# Patient Record
Sex: Female | Born: 1985 | Race: Black or African American | Hispanic: No | Marital: Single | Smoking: Never smoker
Health system: Southern US, Community
[De-identification: ages and names within clinical notes are randomized; demographics above are authoritative.]

## PROBLEM LIST (undated history)

## (undated) DIAGNOSIS — Z789 Other specified health status: Secondary | ICD-10-CM

## (undated) HISTORY — DX: Other specified health status: Z78.9

---
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
## 2006-08-08 ENCOUNTER — Inpatient Hospital Stay (HOSPITAL_COMMUNITY): Admission: EM | Admit: 2006-08-08 | Discharge: 2006-08-10 | Payer: Self-pay | Admitting: Emergency Medicine

## 2008-05-10 ENCOUNTER — Emergency Department (HOSPITAL_COMMUNITY): Admission: EM | Admit: 2008-05-10 | Discharge: 2008-05-10 | Payer: Self-pay | Admitting: Emergency Medicine

## 2008-06-09 ENCOUNTER — Emergency Department (HOSPITAL_COMMUNITY): Admission: EM | Admit: 2008-06-09 | Discharge: 2008-06-09 | Payer: Self-pay | Admitting: Emergency Medicine

## 2008-07-05 IMAGING — CT CT PELVIS W/ CM
2 of 4 series · 17 of 46 positions shown, 19 images · IV contrast (OMNI 350 25 ML & [ID] OMNI 300)
Comparison: none

CLINICAL DATA: Right lower abdominal pain, fever

CT abdomen with contrast:
Multidetector helical CT after 100 ml Vmnipaque-4TT IV.
No previous available for comparison. Visualized lung bases clear. Unremarkable
liver, gallbladder, spleen, adrenal glands, kidneys, abdominal aorta, pancreas,
small bowel. No free air. No ascites.

[Series 2: abd pelvis · axial · 0.70mm/px · z∈[-475,-100]mm · 14 of 83 slices shown, 16 images]
[im 4/83  soft-tissue]
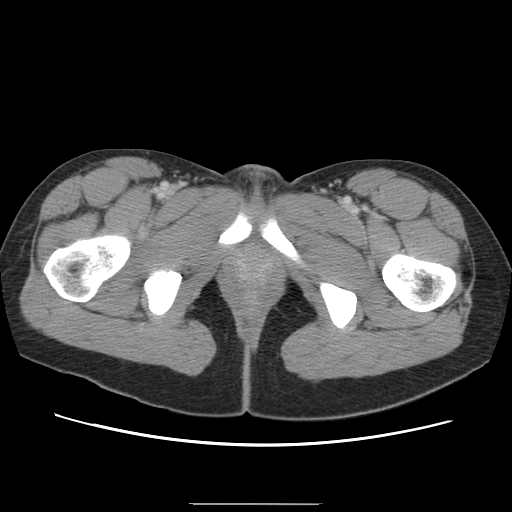
[im 4/83  bone]
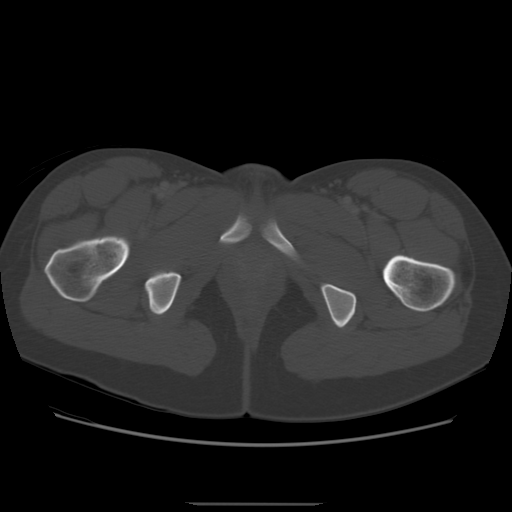
[im 11/83  soft-tissue]
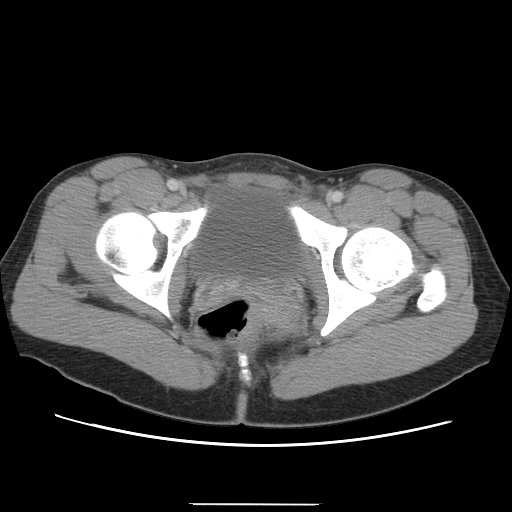
[im 18/83  soft-tissue]
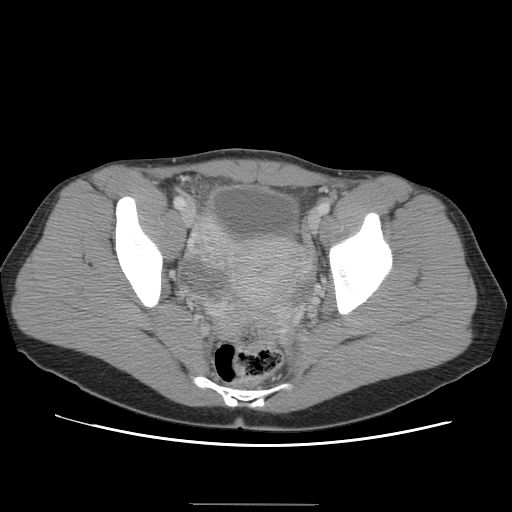
[im 21/83  soft-tissue]
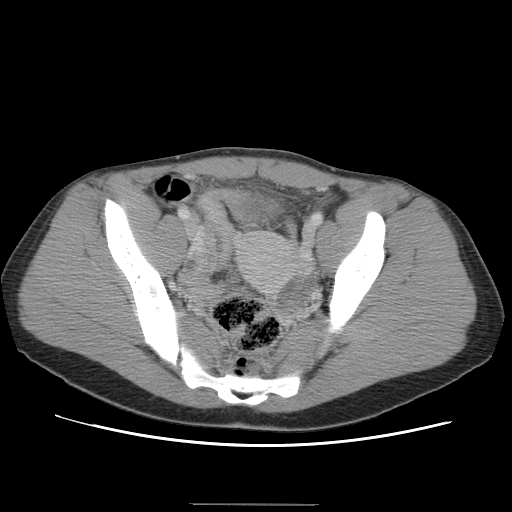
[im 28/83  soft-tissue]
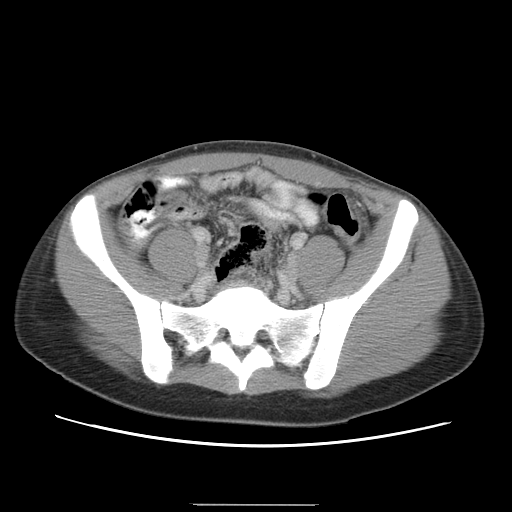
[im 35/83  soft-tissue]
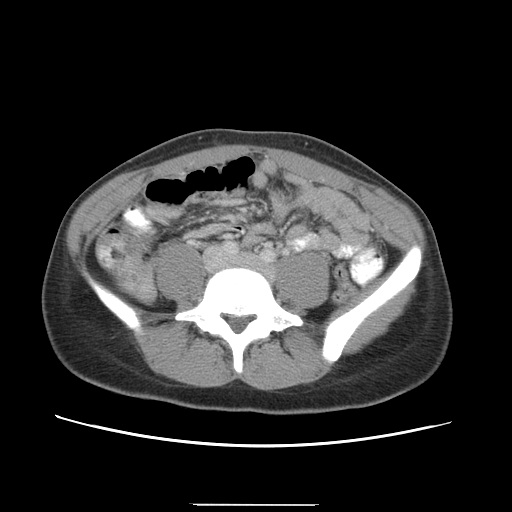
[im 38/83  soft-tissue]
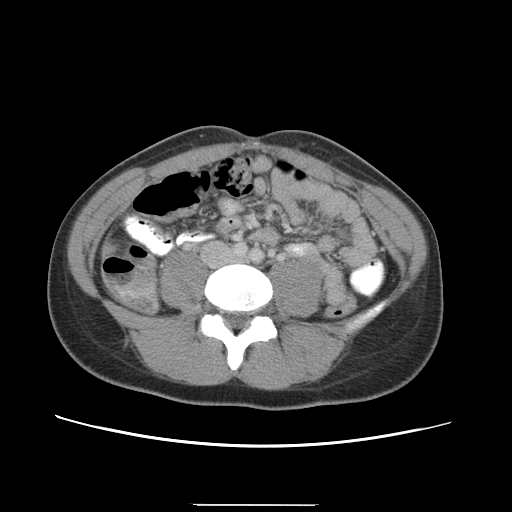
[im 45/83  soft-tissue]
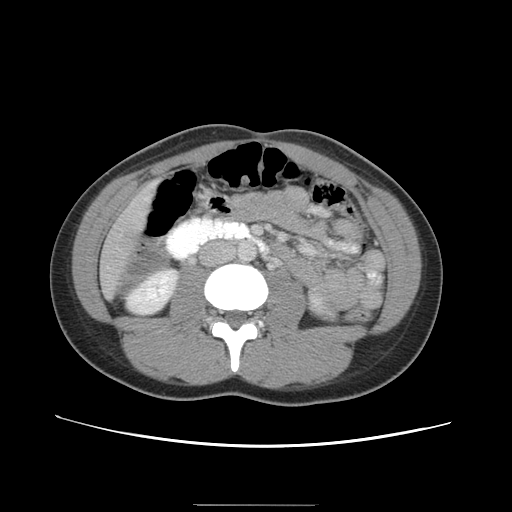
[im 48/83  soft-tissue]
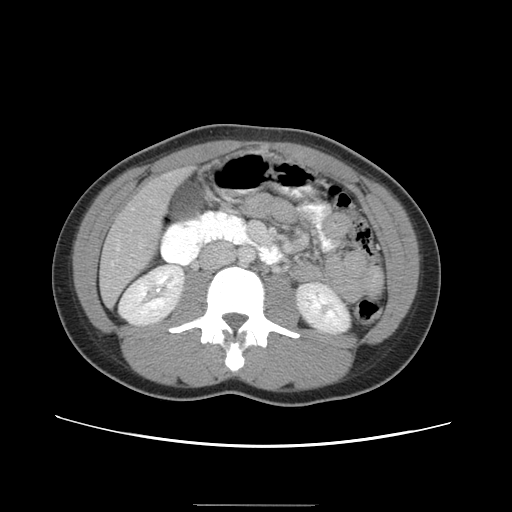
[im 48/83  bone]
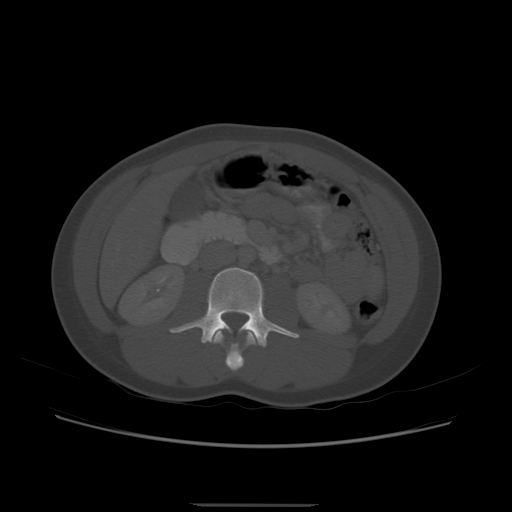
[im 55/83  soft-tissue]
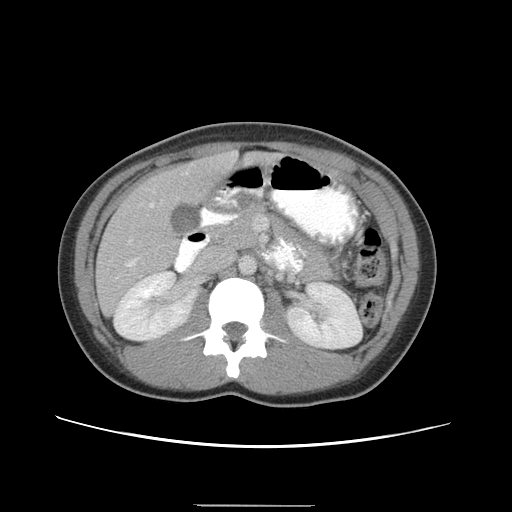
[im 62/83  soft-tissue]
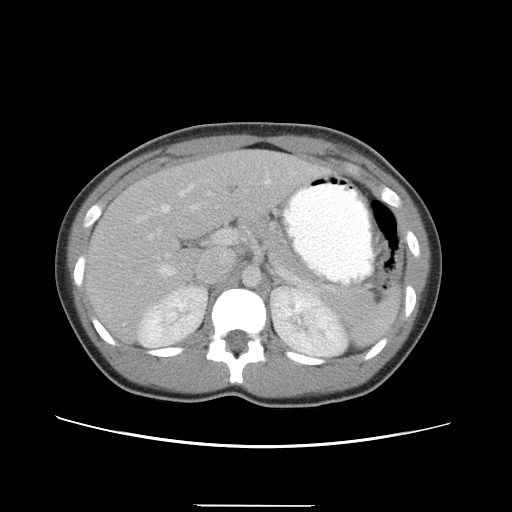
[im 65/83  soft-tissue]
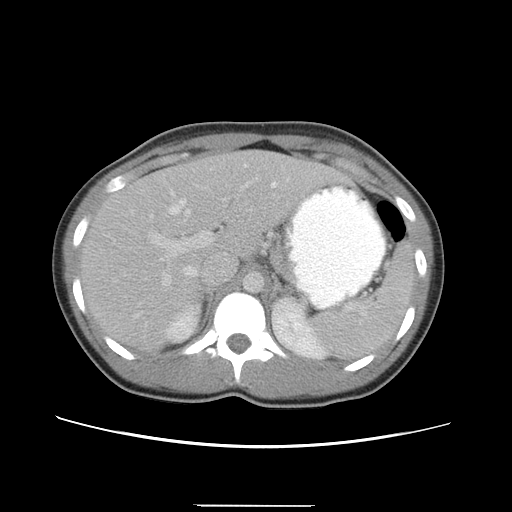
[im 72/83  soft-tissue]
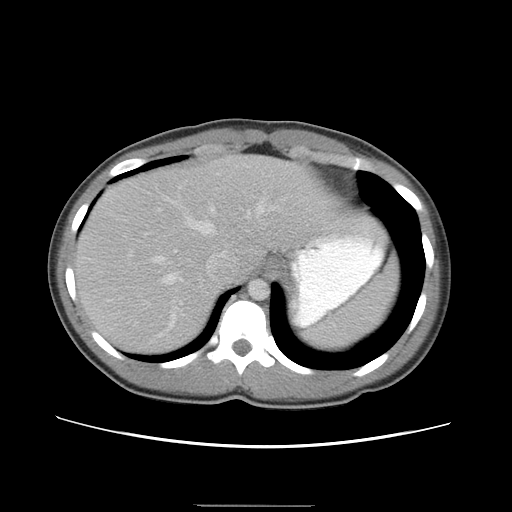
[im 79/83  soft-tissue]
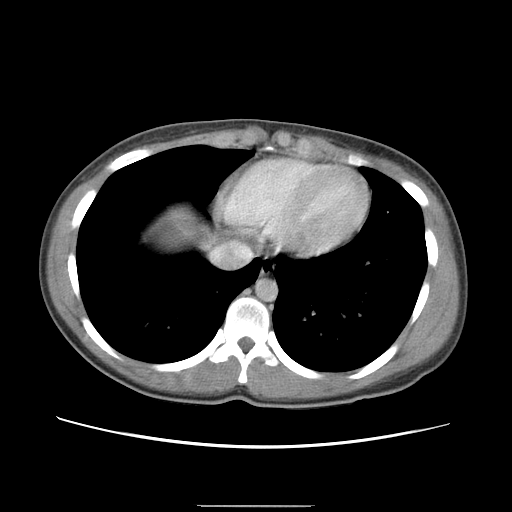

[Series 401: reformatted · coronal · 0.86mm/px · 3 of 87 slices shown]
[im 29/87  soft-tissue]
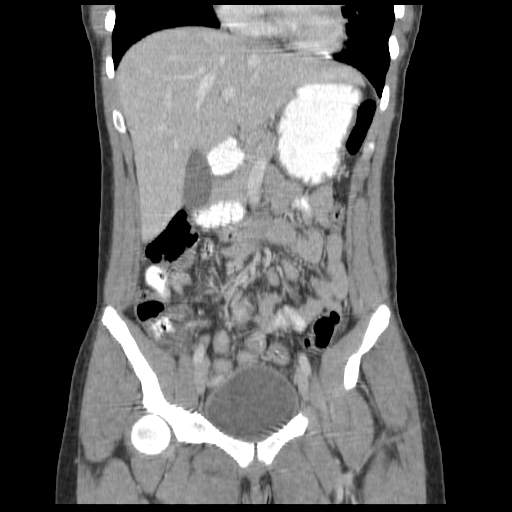
[im 39/87  soft-tissue]
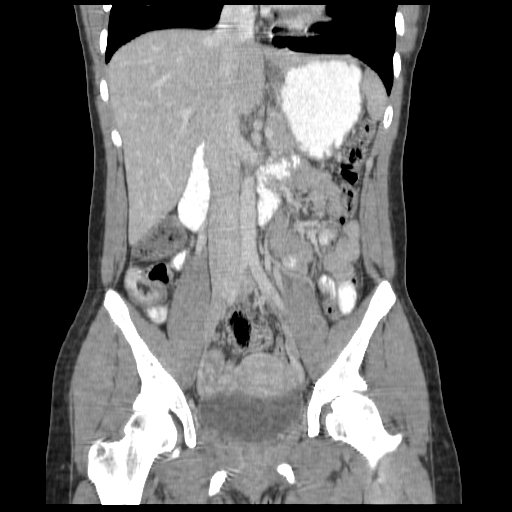
[im 48/87  soft-tissue]
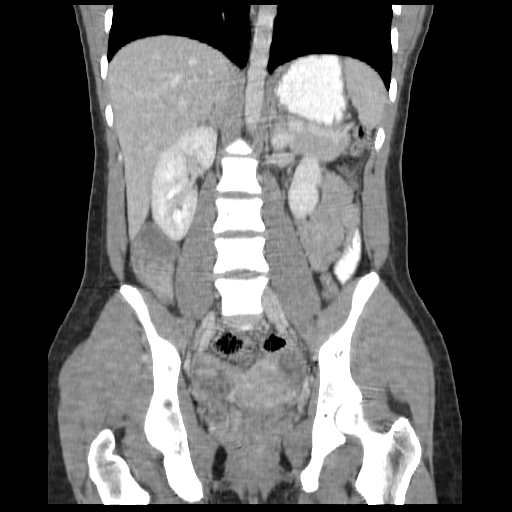

[17 of 46 positions shown; findings below may reference images not displayed]

IMPRESSION: 1. Unremarkable CT abdomen

CT pelvis with contrast:

Appendix not discretely identified. Remainder of the colon is nondilated,
otherwise unremarkable. The terminal ileum is incompletely distended. Prominent
relatively low attenuation  right ovary measures 3.9 cm maximal transverse
diameter. Uterus and left adnexal region unremarkable. Small amount of free
pelvic fluid. No pelvic adenopathy.
IMPRESSION: 1. Prominent asymmetric ovaries, right greater than left. If there is a clinical
concern for   torsion or tubo-ovarian abscess, pelvic ultrasound may be useful
for further evaluation.
2. Appendix is not discretely identified.

## 2008-11-25 ENCOUNTER — Emergency Department (HOSPITAL_COMMUNITY): Admission: EM | Admit: 2008-11-25 | Discharge: 2008-11-25 | Payer: Self-pay | Admitting: Emergency Medicine

## 2008-12-02 ENCOUNTER — Emergency Department (HOSPITAL_COMMUNITY): Admission: EM | Admit: 2008-12-02 | Discharge: 2008-12-02 | Payer: Self-pay | Admitting: Emergency Medicine

## 2009-11-13 ENCOUNTER — Emergency Department (HOSPITAL_COMMUNITY): Admission: EM | Admit: 2009-11-13 | Discharge: 2009-11-13 | Payer: Self-pay | Admitting: Emergency Medicine

## 2009-12-11 ENCOUNTER — Emergency Department (HOSPITAL_COMMUNITY): Admission: EM | Admit: 2009-12-11 | Discharge: 2009-12-11 | Payer: Self-pay | Admitting: *Deleted

## 2010-06-08 LAB — POCT PREGNANCY, URINE: Preg Test, Ur: NEGATIVE

## 2010-06-08 LAB — URINE MICROSCOPIC-ADD ON

## 2010-06-08 LAB — URINALYSIS, ROUTINE W REFLEX MICROSCOPIC
Bilirubin Urine: NEGATIVE
Glucose, UA: NEGATIVE mg/dL
Ketones, ur: NEGATIVE mg/dL
Leukocytes, UA: NEGATIVE
Protein, ur: NEGATIVE mg/dL

## 2010-07-17 NOTE — H&P (Signed)
NAMEBANI, GIANFRANCESCO            ACCOUNT NO.:  [id number]   MEDICAL RECORD NO.:  [id number]          PATIENT TYPE:  INP   LOCATION:  5733                         FACILITY:  [facility]   PHYSICIAN:  Della Goo, M.D. DATE OF BIRTH:  May 16, 1985   DATE OF ADMISSION:  08/08/2006  DATE OF DISCHARGE:                              HISTORY & PHYSICAL   This is an unassigned patient.   CHIEF COMPLAINT:  Abdominal and flank pain.   HISTORY OF PRESENT ILLNESS:  This is 25 year old female who presented to  an outside Urgent Care Center with complaints of right lower quadrant  pain that she reported had started after work last night.  The pain  radiated across the lower abdomen and was associated with painful  urination.  She also reports having right-sided flank pain.  She denies  having any changes in her bowel pattern.  She denies having any diarrhea  or constipation.  She denies having vaginal discharge.  The patient was  evaluated in the emergency department by the emergency department  physician.  Initial studies revealed a urinalysis with small leukocyte  esterase.  A pelvic exam was performed and a wet prep which did reveal  Trichomonas, clue cells, and moderate leukocytes.  A CBC and chemistry  were also performed.  The white blood cell count returned at 22.1 with  94% neutrophils.  The chemistry was within normal limits.  A CT of the  abdomen and pelvis was also ordered secondary to the patient's right  lower quadrant pain to evaluate for possible appendicitis, and the  results returned with prominent asymmetric ovaries, right greater than  left.  The appendix was not discretely identified; however, the results  were reported as being unremarkable of the abdomen.   PAST MEDICAL HISTORY:  None.   PAST SURGICAL HISTORY:  None.   MEDICATIONS:  None.   ALLERGIES:  NO KNOWN DRUG ALLERGIES.   SOCIAL HISTORY:  Positive tobacco, three cigarettes per day, Black and  Milds, for 5  years.  No history of alcohol usage.  No history of illicit  drug usage.   FAMILY HISTORY:  Hypertension in maternal grandfather.  No history of  diabetes, coronary artery disease, or cancer in her family that she  knows of.   PHYSICAL EXAMINATION:  GENERAL:  This is a 25 year old thin, well-  nourished, well-developed female in discomfort but no acute distress.  VITAL SIGNS:  Temperature 100.7, blood pressure 105/50, heart rate 88-  125, respirations 16-20, O2 saturations 99-100% on room air.  HEENT:  Normocephalic, atraumatic.  She does have old healed facial  lacerations, an oblique scar from the right oral corner.  This scar is  approximately 2 cm long.  Pupils equally round and reactive to light.  Extraocular muscles are intact.  Funduscopic benign.  Oropharynx is  clear.  NECK:  Supple, full range of motion.  No thyromegaly, adenopathy,  jugular venous distension.  CARDIOVASCULAR:  Regular rate and rhythm.  No murmurs, gallops or rubs.  LUNGS:  Clear to auscultation bilaterally.  ABDOMEN:  Positive bowel sounds, soft, mildly tender in the right lower  quadrant.  No rebound, no guarding.  No hepatosplenomegaly.  PELVIC:  Examination performed by the ER physician.  EXTREMITIES:  Without cyanosis, clubbing, or edema.  NEUROLOGIC:  Alert and oriented x3.  Nonfocal.   LABORATORY STUDIES:  White blood cell count 22.1, hemoglobin 12.0,  hematocrit 37.0, platelets 242, neutrophils 94%.  Sodium 136, potassium  3.6, chloride 105, carbon dioxide 25, BUN 8, creatinine 0.85, glucose  113.  Albumin 3.7, AST 15, ALT 10.  Urinalysis:  Trace urine hemoglobin,  small leukocyte esterase, and positive Trichomonas.  Wet prep, as  mentioned above, along with CT scan results mentioned above.   ASSESSMENT:  25 year old female being admitted with:  1. Abdominal pain.  2. Pyelonephritis and possible pelvic inflammatory disease.  3. Trichomonas vaginitis.  4. Leukocytosis.   PLAN:  The patient has  been treated initially with Rocephin and  azithromycin to cover for gonorrhea and chlamydial infections.  Gonorrhea and chlamydia screens have been performed and are pending.  The patient will be placed on Rocephin daily IV to treat the  pyelonephritis/UTI, and Flagyl therapy has also been initiated in a  single dose therapy for treatment of Trichomonas at 2 grams p.o. x1  dose.  Further treatment will ensue pending culture results.  The  patient will be placed on IV fluids for fluid rehydration, and  antiemetic and pain therapy have been ordered.      Della Goo, M.D.  Electronically Signed     HJ/MEDQ  D:  08/09/2006  T:  08/10/2006  Job:  [id number]

## 2010-07-20 NOTE — Op Note (Signed)
   NAME:  [name], [name] NO.:  [id number]   MEDICAL RECORD NO.:  [id number]                   PATIENT TYPE:  INP   LOCATION:  LDR1                                 FACILITY:  [facility]   PHYSICIAN:  Lazaro Arms, M.D.                DATE OF BIRTH:  12-28-1985   DATE OF PROCEDURE:  09/29/2002  DATE OF DISCHARGE:                                 OPERATIVE REPORT   EPIDURAL PLACEMENT NOTE:  Phyllis Lawson is a 25 year old African-American female,  gravida 1, at 4 cm dilated requesting an epidural be placed.   She is placed in sitting position.  The iliac crests are identified; the L2-  L3, and L3-L4 interspaces are identified.  Betadine prep is used, field  drape is placed, and 1% Lidocaine is placed at the L3-L4 interspace.  A 17-  gauge Touhy needle was used and a loss of resistance technique employed.  By  the 3 cm mark despite using the loss of resistance technique a wet tap is  performed.  I immediately withdrew the 17-gauge Touhy needle and placed it  again.  The epidural space is basically 2.5 cm from the skin; 15 cc of  peripheral blood was drawn and injected as a blood patch. Ten cc of 8%  Marcaine was injected as a test dose with no ill effects.  There was no  ringing in the ears and no metallic taste and no other symptomatology.  An  epidural catheter was threaded without difficulty.  An additional 10 cc was  given through the catheter; again, with no ill effects.  There is no  epidural pump available.  The patient is now 9 cm.  It was taped down at  this point and no continuous pump is to be used currently.  The patient is  informed of the wet tap and that she may indeed still get a spinal headache  despite the blood patch placed at this time.  If that is the case we will  place another blood patch later.  If she needs any more medicine we will  give her 5 cc of 1.5% 1% lidocaine.                                               Lazaro Arms, M.D.    Loraine Maple  D:  09/29/2002  T:  09/29/2002  Job:  [id number]

## 2010-07-20 NOTE — Op Note (Signed)
   NAME:  VERLEE, POPE NO.:  [id number]   MEDICAL RECORD NO.:  [id number]                   PATIENT TYPE:  INP   LOCATION:  LDR1                                 FACILITY:  [facility]   PHYSICIAN:  Lazaro Arms, M.D.                DATE OF BIRTH:  1985-04-22   DATE OF PROCEDURE:  DATE OF DISCHARGE:                                  PROCEDURE NOTE   Onset of labor is going to be September 29, 2002 at 9 a.m.  Date of delivery September 29, 2002 at 1509 p.m.  Length of first stage of labor: 5 hours and 25  minutes.  Length of second stage of labor:  44 minutes.  Length of third  stage of labor:  6 minutes.   DELIVERY NOTE:  [place] had a normal spontaneous vaginal delivery under  epidural anesthesia with a viable female infant.  Apgars were 9 and 9.  Upon  delivery of head, shoulders were easily delivered.  Infant was thoroughly  suctioned, dried, cord clamped and placed on the mother's abdomen in good  condition with vigorous cry, good movement of all extremities and pink  color.  First stage of labor was actively managed with 1000 cc of D5LR at a  rapid rate.  Placenta was delivered spontaneously via Schultze  mechanism.  A 3-vessel cord was noted upon inspection and membranes were intact upon  inspection.  Estimated blood loss:  Approximately 300 cc.  The epidural  catheter was removed with low tip intact, without complications.     Zerita Boers, N.M.                      Lazaro Arms, M.D.    DL/MEDQ  D:  16/12/9602  T:  09/29/2002  Job:  [id number]   cc:   Sharyon Medicus
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
## 2010-08-02 ENCOUNTER — Other Ambulatory Visit (HOSPITAL_COMMUNITY)
Admission: RE | Admit: 2010-08-02 | Discharge: 2010-08-02 | Disposition: A | Discharge status: Home / Self Care | Payer: BC Managed Care – PPO | Source: Ambulatory Visit | Attending: Obstetrics and Gynecology | Admitting: Obstetrics and Gynecology

## 2010-08-02 DIAGNOSIS — Z113 Encounter for screening for infections with a predominantly sexual mode of transmission: Secondary | ICD-10-CM | POA: Insufficient documentation

## 2010-08-02 DIAGNOSIS — Z01419 Encounter for gynecological examination (general) (routine) without abnormal findings: Secondary | ICD-10-CM | POA: Insufficient documentation

## 2010-10-27 ENCOUNTER — Inpatient Hospital Stay (INDEPENDENT_AMBULATORY_CARE_PROVIDER_SITE_OTHER)
Admission: RE | Admit: 2010-10-27 | Discharge: 2010-10-27 | Disposition: A | Discharge status: Home / Self Care | Payer: BC Managed Care – PPO | Source: Ambulatory Visit | Attending: Family Medicine | Admitting: Family Medicine

## 2010-10-27 ENCOUNTER — Ambulatory Visit (INDEPENDENT_AMBULATORY_CARE_PROVIDER_SITE_OTHER): Payer: BC Managed Care – PPO

## 2010-10-27 DIAGNOSIS — S6390XA Sprain of unspecified part of unspecified wrist and hand, initial encounter: Secondary | ICD-10-CM

## 2010-12-20 LAB — POCT URINALYSIS DIP (DEVICE)
Glucose, UA: 100 — AB
Nitrite: NEGATIVE
Operator id: 116391
Urobilinogen, UA: 4 — ABNORMAL HIGH

## 2010-12-20 LAB — DIFFERENTIAL
Basophils Absolute: 0
Basophils Relative: 0
Eosinophils Absolute: 0
Lymphocytes Relative: 3 — ABNORMAL LOW
Lymphs Abs: 0.7
Monocytes Relative: 3
Neutro Abs: 20.7 — ABNORMAL HIGH

## 2010-12-20 LAB — GC/CHLAMYDIA PROBE AMP, GENITAL
Chlamydia, DNA Probe: POSITIVE — AB
GC Probe Amp, Genital: POSITIVE — AB

## 2010-12-20 LAB — COMPREHENSIVE METABOLIC PANEL
AST: 15
Albumin: 3.7
Alkaline Phosphatase: 73
Chloride: 105
GFR calc Af Amer: >60
Potassium: 3.6
Total Bilirubin: 0.8
Total Protein: 7

## 2010-12-20 LAB — WET PREP, GENITAL

## 2010-12-20 LAB — HIV ANTIBODY (ROUTINE TESTING W REFLEX): HIV: NONREACTIVE

## 2010-12-20 LAB — POCT PREGNANCY, URINE
Operator id: 116391
Preg Test, Ur: NEGATIVE

## 2010-12-20 LAB — CBC
Platelets: 242
RDW: 15.1 — ABNORMAL HIGH
WBC: 22.1 — ABNORMAL HIGH

## 2010-12-20 LAB — URINE MICROSCOPIC-ADD ON

## 2010-12-20 LAB — URINALYSIS, ROUTINE W REFLEX MICROSCOPIC
Bilirubin Urine: NEGATIVE
Glucose, UA: NEGATIVE
Specific Gravity, Urine: >1.046 — ABNORMAL HIGH
Urobilinogen, UA: 1

## 2010-12-20 LAB — HEPATITIS PANEL, ACUTE
Hep B C IgM: NEGATIVE
Hepatitis B Surface Ag: NEGATIVE

## 2010-12-20 LAB — CULTURE, BLOOD (ROUTINE X 2)

## 2011-11-23 ENCOUNTER — Emergency Department (INDEPENDENT_AMBULATORY_CARE_PROVIDER_SITE_OTHER)
Admission: EM | Admit: 2011-11-23 | Discharge: 2011-11-23 | Disposition: A | Discharge status: Home / Self Care | Payer: BC Managed Care – PPO | Source: Home / Self Care | Attending: Emergency Medicine | Admitting: Emergency Medicine

## 2011-11-23 ENCOUNTER — Encounter (HOSPITAL_COMMUNITY): Payer: Self-pay

## 2011-11-23 DIAGNOSIS — T148XXA Other injury of unspecified body region, initial encounter: Secondary | ICD-10-CM

## 2011-11-23 MED ORDER — MELOXICAM 15 MG PO TABS: 15.0000 mg | ORAL_TABLET | Freq: Every day | ORAL | Status: DC

## 2011-11-23 NOTE — ED Notes (Signed)
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

## 2011-11-23 NOTE — ED Provider Notes (Signed)
History     CSN: [id number]  Arrival date & time 11/23/11  4098   First MD Initiated Contact with Patient 11/23/11 [phone]      Chief Complaint  Patient presents with  . Hand Pain    (Consider location/radiation/quality/duration/timing/severity/associated sxs/prior treatment) HPI Comments: Right-handed female presents with left lateral hand pain, worse with gripping objects, use. 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 No recent or remote history of trauma to her hand. No numbness, weakness, paresthesias, bruising, deformity. She is not a smoker or diabetic.  ROS as noted in HPI. All other ROS negative.   Patient is a 26 y.o. female presenting with hand pain. The history is provided by the patient. No language interpreter was used.  Hand Pain This is a new problem. The current episode started more than 1 week ago. The problem occurs daily. The problem has not changed since onset.The symptoms are aggravated by twisting. Nothing relieves the symptoms. She has tried nothing for the symptoms. The treatment provided no relief.    History reviewed. No pertinent past medical history.  History reviewed. No pertinent past surgical history.  History reviewed. No pertinent family history.  History  Substance Use Topics  . Smoking status: Never Smoker   . Smokeless tobacco: Not on file  . Alcohol Use: Yes    OB History    Grav Para Term Preterm Abortions TAB SAB Ect Mult Living                  Review of Systems  Allergies  Review of patient's allergies indicates no known allergies.  Home Medications   Current Outpatient Rx  Name Route Sig Dispense Refill  . MELOXICAM 15 MG PO TABS Oral Take 1 tablet (15 mg total) by mouth daily. 14 tablet 0    BP 115/59  Pulse 70  Temp 98.8 F (37.1 C) (Oral)  Resp 18  SpO2 100%  LMP 11/21/2011  Physical Exam  Nursing note and vitals  reviewed. Constitutional: She is oriented to person, place, and time. She appears well-developed and well-nourished. No distress.  HENT:  Head: Normocephalic and atraumatic.  Eyes: Conjunctivae normal and EOM are normal.  Neck: Normal range of motion.  Cardiovascular: Normal rate.   Pulmonary/Chest: Effort normal.  Abdominal: She exhibits no distension.  Musculoskeletal: Normal range of motion.       Hands:      Mild soft tissue tenderness along 4th 5th metacarpals. No bony tenderness. Pain aggravated with grasping. Tinel negative.  Grip 5/5 bilaterally.  Strength and Sensation to L hand with normal light touch intact for Pt, distal motor and sensation in median/radial/ulnar nerve distribution with CR< 2 secs and pulse intact. Skin intact. No signs of trauma. Wrist WNL.    Neurological: She is alert and oriented to person, place, and time. Coordination normal.  Skin: Skin is warm and dry.  Psychiatric: She has a normal mood and affect. Her behavior is normal. Judgment and thought content normal.    ED Course  Procedures (including critical care time)  Labs Reviewed - No data to display No results found.   1. Repetitive strain injury     MDM  No bony tenderness, deferring imaging. Presentation is most consistent with repetitive strain injury. Place him on her gutter splint for comfort, protection. Also so that she can continue use her left hand for daily activities. Home with Mobic. Referring  to Dr. Izora Ribas, hand on call, if no improvement after conservative therapy for 10 days. Discussed signs and symptoms that should prompt return to the department. Patient agrees with plan.  Luiz Blare, MD 11/23/11 1014

## 2011-11-23 NOTE — Progress Notes (Signed)
Orthopedic Tech Progress Note Patient Details:  Phyllis Lawson 01-Jun-1985 [id number]  Ortho Devices Type of Ortho Device: Ulna gutter splint Ortho Device/Splint Location: left hand Ortho Device/Splint Interventions: Application   [name] 11/23/2011, 10:52 AM

## 2012-05-12 ENCOUNTER — Other Ambulatory Visit (HOSPITAL_COMMUNITY)
Admission: RE | Admit: 2012-05-12 | Discharge: 2012-05-12 | Disposition: A | Discharge status: Home / Self Care | Payer: BC Managed Care – PPO | Source: Ambulatory Visit | Attending: Obstetrics and Gynecology | Admitting: Obstetrics and Gynecology

## 2012-05-12 ENCOUNTER — Other Ambulatory Visit: Payer: Self-pay | Admitting: Nurse Practitioner

## 2012-05-12 DIAGNOSIS — N760 Acute vaginitis: Secondary | ICD-10-CM | POA: Insufficient documentation

## 2012-05-12 DIAGNOSIS — Z01419 Encounter for gynecological examination (general) (routine) without abnormal findings: Secondary | ICD-10-CM | POA: Insufficient documentation

## 2016-09-17 ENCOUNTER — Encounter (HOSPITAL_COMMUNITY): Payer: Self-pay

## 2016-09-17 ENCOUNTER — Emergency Department (HOSPITAL_COMMUNITY): Payer: BLUE CROSS/BLUE SHIELD

## 2016-09-17 ENCOUNTER — Emergency Department (HOSPITAL_COMMUNITY)
Admission: EM | Admit: 2016-09-17 | Discharge: 2016-09-17 | Disposition: A | Discharge status: Home / Self Care | Payer: BLUE CROSS/BLUE SHIELD | Attending: Emergency Medicine | Admitting: Emergency Medicine

## 2016-09-17 DIAGNOSIS — Y999 Unspecified external cause status: Secondary | ICD-10-CM | POA: Insufficient documentation

## 2016-09-17 DIAGNOSIS — Y9389 Activity, other specified: Secondary | ICD-10-CM | POA: Insufficient documentation

## 2016-09-17 DIAGNOSIS — S161XXA Strain of muscle, fascia and tendon at neck level, initial encounter: Secondary | ICD-10-CM | POA: Diagnosis not present

## 2016-09-17 DIAGNOSIS — Z79899 Other long term (current) drug therapy: Secondary | ICD-10-CM | POA: Insufficient documentation

## 2016-09-17 DIAGNOSIS — Y929 Unspecified place or not applicable: Secondary | ICD-10-CM | POA: Insufficient documentation

## 2016-09-17 DIAGNOSIS — M25512 Pain in left shoulder: Secondary | ICD-10-CM | POA: Insufficient documentation

## 2016-09-17 DIAGNOSIS — S199XXA Unspecified injury of neck, initial encounter: Secondary | ICD-10-CM | POA: Diagnosis present

## 2016-09-17 MED ORDER — METHOCARBAMOL 500 MG PO TABS: 500.0000 mg | ORAL_TABLET | Freq: Two times a day (BID) | ORAL | 0 refills | Status: DC

## 2016-09-17 MED ORDER — NAPROXEN 500 MG PO TABS: 500.0000 mg | ORAL_TABLET | Freq: Two times a day (BID) | ORAL | 0 refills | Status: DC

## 2016-09-17 NOTE — ED Notes (Signed)
Pt verbalized understanding of d/c instructions and has no further questions. Pt stable and NAD. Ambulatory.

## 2016-09-17 NOTE — ED Notes (Signed)
Pt taken to xray 
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
## 2016-09-17 NOTE — Discharge Instructions (Signed)
Ice/heat. Naprosyn for pain. Robaxin for spasms. Follow up with family doctor as needed.

## 2017-08-12 ENCOUNTER — Encounter (HOSPITAL_COMMUNITY): Payer: Self-pay | Admitting: Emergency Medicine

## 2017-08-12 ENCOUNTER — Emergency Department (HOSPITAL_COMMUNITY): Payer: Self-pay

## 2017-08-12 ENCOUNTER — Emergency Department (HOSPITAL_COMMUNITY)
Admission: EM | Admit: 2017-08-12 | Discharge: 2017-08-12 | Disposition: A | Discharge status: Home / Self Care | Payer: Self-pay | Attending: Emergency Medicine | Admitting: Emergency Medicine

## 2017-08-12 ENCOUNTER — Other Ambulatory Visit: Payer: Self-pay

## 2017-08-12 DIAGNOSIS — M549 Dorsalgia, unspecified: Secondary | ICD-10-CM | POA: Insufficient documentation

## 2017-08-12 DIAGNOSIS — M25512 Pain in left shoulder: Secondary | ICD-10-CM | POA: Insufficient documentation

## 2017-08-12 MED ORDER — NAPROXEN 500 MG PO TABS: 500.0000 mg | ORAL_TABLET | Freq: Two times a day (BID) | ORAL | 0 refills | Status: DC

## 2017-08-12 MED ORDER — CYCLOBENZAPRINE HCL 10 MG PO TABS: 10.0000 mg | ORAL_TABLET | Freq: Two times a day (BID) | ORAL | 0 refills | Status: DC | PRN
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
## 2017-08-12 NOTE — ED Notes (Signed)
Patient transported to X-ray 
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
## 2018-02-26 ENCOUNTER — Encounter: Payer: Self-pay | Admitting: Obstetrics & Gynecology

## 2018-03-04 NOTE — L&D Delivery Note (Addendum)
Patient: Phyllis Lawson MRN: [id number]  GBS status: Negative  Patient is a 33 y.o. now G2P1001 s/p NSVD at [redacted]w[redacted]d, who was admitted for SOL. AROM 8h 74m prior to delivery with clear fluid.    Delivery Note At 8:04 PM a viable female was delivered via Vaginal, Spontaneous (Presentation Right OA).  APGAR: 9,9 ; weight pending.   Placenta status: spontaneous, intact. Cord: 3 vessel, intact with the following complications: Shoulder dystocia resolved with McRoberts.   Anesthesia: None  Episiotomy: None Lacerations: None  Est. Blood Loss (mL): 44   Head delivered right OA. No nuchal cord present. Brief shoulder dystocia lasting less than 30 seconds likely related to maternal position and tight intriotus that resolved with McRoberts, body delivered in usual fashion. Infant with spontaneous cry, placed on mother's abdomen, dried and bulb suctioned. Cord clamped x 2 after 1-minute delay, and cut by family member. Cord blood drawn. Placenta delivered spontaneously with gentle cord traction. Fundus firm with massage and Pitocin. Perineum inspected and found to have no lacerations.  Mom to postpartum.  Baby to Couplet care / Skin to Skin.  Patriciaann Clan 09/08/2018, 8:18 PM    OB FELLOW DELIVERY ATTESTATION  I was gloved and present for the delivery in its entirety, and I agree with the above resident's note.    [name], D.O. OB Fellow  09/08/2018, 8:30 PM

## 2018-03-16 ENCOUNTER — Encounter: Payer: Self-pay | Admitting: Obstetrics & Gynecology

## 2018-03-16 ENCOUNTER — Ambulatory Visit (INDEPENDENT_AMBULATORY_CARE_PROVIDER_SITE_OTHER): Payer: 59 | Admitting: Obstetrics & Gynecology

## 2018-03-16 VITALS — BP 124/64 | HR 91 | Wt 186.0 lb

## 2018-03-16 DIAGNOSIS — Z23 Encounter for immunization: Secondary | ICD-10-CM

## 2018-03-16 DIAGNOSIS — O09891 Supervision of other high risk pregnancies, first trimester: Secondary | ICD-10-CM

## 2018-03-16 DIAGNOSIS — Z1151 Encounter for screening for human papillomavirus (HPV): Secondary | ICD-10-CM | POA: Diagnosis not present

## 2018-03-16 DIAGNOSIS — Z113 Encounter for screening for infections with a predominantly sexual mode of transmission: Secondary | ICD-10-CM

## 2018-03-16 DIAGNOSIS — Z3480 Encounter for supervision of other normal pregnancy, unspecified trimester: Secondary | ICD-10-CM | POA: Insufficient documentation

## 2018-03-16 DIAGNOSIS — O09899 Supervision of other high risk pregnancies, unspecified trimester: Secondary | ICD-10-CM

## 2018-03-16 DIAGNOSIS — Z124 Encounter for screening for malignant neoplasm of cervix: Secondary | ICD-10-CM

## 2018-03-16 NOTE — Progress Notes (Signed)
  Subjective:    Phyllis Lawson is being seen today for her first obstetrical visit.  This is not a planned pregnancy. She is at [redacted]w[redacted]d gestation. Her obstetrical history is significant for overweight. Relationship with FOB: significant other, living together. Patient does intend to breast feed. Pregnancy history fully reviewed.  Patient reports no complaints.  Review of Systems:   Review of Systems  Objective:     BP 124/64   Pulse 91   Wt 186 lb (84.4 kg)   LMP 12/29/2017   BMI 29.13 kg/m  Physical Exam  Exam Heart- rrr Lungs- CTAB Abd- benign Vulva, vagina, cervix- normal Pelvis- proven for 6#5 ounces   Assessment:    Pregnancy: G2P1000 Patient Active Problem List   Diagnosis Date Noted  . Supervision of other high risk pregnancy, antepartum 03/16/2018       Plan:     Initial labs drawn. Prenatal vitamins. Problem list reviewed and updated. AFP3 discussed: declined. Role of ultrasound in pregnancy discussed; fetal survey: ordered. Amniocentesis discussed: not indicated. Baby scripts ([place])   Emily Filbert 03/16/2018

## 2018-03-16 NOTE — Progress Notes (Signed)
Bedside U/S shows CRL of 68.63mm  Gestational age is [redacted]w[redacted]d  FHT of 174 BPM

## 2018-03-19 LAB — CYTOLOGY - PAP
Chlamydia: NEGATIVE
Diagnosis: NEGATIVE
HPV 16/18/45 genotyping: NEGATIVE
HPV: DETECTED — AB
Neisseria Gonorrhea: NEGATIVE

## 2018-03-20 LAB — URINE CULTURE, OB REFLEX

## 2018-03-20 LAB — CULTURE, OB URINE

## 2018-03-23 ENCOUNTER — Telehealth: Payer: Self-pay | Admitting: *Deleted

## 2018-03-23 ENCOUNTER — Encounter: Payer: Self-pay | Admitting: *Deleted

## 2018-03-23 DIAGNOSIS — O09899 Supervision of other high risk pregnancies, unspecified trimester: Secondary | ICD-10-CM

## 2018-03-23 MED ORDER — NITROFURANTOIN MONOHYD MACRO 100 MG PO CAPS: ORAL_CAPSULE | ORAL | 0 refills | Status: DC

## 2018-03-23 NOTE — Telephone Encounter (Signed)
-----   Message from Emily Filbert, MD sent at 03/23/2018  7:01 AM EST ----- She needs macrobid for 10 days. Thanks

## 2018-03-23 NOTE — Telephone Encounter (Signed)
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

## 2018-03-24 LAB — OBSTETRIC PANEL
ANTIBODY SCREEN: NOT DETECTED
Absolute Monocytes: 739 cells/uL (ref 200–950)
BASOS PCT: 0.2 %
Basophils Absolute: 22 cells/uL (ref 0–200)
EOS ABS: 134 {cells}/uL (ref 15–500)
EOS PCT: 1.2 %
HEMATOCRIT: 36.2 % (ref 35.0–45.0)
HEMOGLOBIN: 12.2 g/dL (ref 11.7–15.5)
Hepatitis B Surface Ag: NONREACTIVE
Lymphs Abs: 2498 cells/uL (ref 850–3900)
MCH: 27 pg (ref 27.0–33.0)
MCHC: 33.7 g/dL (ref 32.0–36.0)
MCV: 80.1 fL (ref 80.0–100.0)
MONOS PCT: 6.6 %
MPV: 10.3 fL (ref 7.5–12.5)
NEUTROS ABS: 7806 {cells}/uL — AB (ref 1500–7800)
Neutrophils Relative %: 69.7 %
Platelets: 275 10*3/uL (ref 140–400)
RBC: 4.52 10*6/uL (ref 3.80–5.10)
RDW: 13.1 % (ref 11.0–15.0)
RPR: NONREACTIVE
Rubella: 0.96 index — ABNORMAL LOW
Total Lymphocyte: 22.3 %
WBC: 11.2 10*3/uL — AB (ref 3.8–10.8)

## 2018-03-24 LAB — HEMOGLOBIN A1C
HEMOGLOBIN A1C: 5.5 %{Hb} (ref <5.7)
MEAN PLASMA GLUCOSE: 111 (calc)
eAG (mmol/L): 6.2 (calc)

## 2018-03-24 LAB — CYSTIC FIBROSIS DIAGNOSTIC STUDY

## 2018-03-24 LAB — HIV ANTIBODY (ROUTINE TESTING W REFLEX): HIV 1&2 Ab, 4th Generation: NONREACTIVE

## 2018-05-01 ENCOUNTER — Ambulatory Visit (HOSPITAL_COMMUNITY)
Admission: RE | Admit: 2018-05-01 | Discharge: 2018-05-01 | Disposition: A | Discharge status: Home / Self Care | Payer: 59 | Source: Ambulatory Visit | Attending: Obstetrics & Gynecology | Admitting: Obstetrics & Gynecology

## 2018-05-01 DIAGNOSIS — Z3A19 19 weeks gestation of pregnancy: Secondary | ICD-10-CM

## 2018-05-01 DIAGNOSIS — Z363 Encounter for antenatal screening for malformations: Secondary | ICD-10-CM | POA: Diagnosis not present

## 2018-05-01 DIAGNOSIS — O09899 Supervision of other high risk pregnancies, unspecified trimester: Secondary | ICD-10-CM | POA: Insufficient documentation

## 2018-05-04 ENCOUNTER — Encounter: Payer: Self-pay | Admitting: Obstetrics & Gynecology

## 2018-05-04 ENCOUNTER — Ambulatory Visit (INDEPENDENT_AMBULATORY_CARE_PROVIDER_SITE_OTHER): Payer: 59 | Admitting: Obstetrics & Gynecology

## 2018-05-04 VITALS — BP 119/60 | HR 90 | Wt 193.0 lb

## 2018-05-04 DIAGNOSIS — Z3A20 20 weeks gestation of pregnancy: Secondary | ICD-10-CM

## 2018-05-04 DIAGNOSIS — O09899 Supervision of other high risk pregnancies, unspecified trimester: Secondary | ICD-10-CM

## 2018-05-04 DIAGNOSIS — O2340 Unspecified infection of urinary tract in pregnancy, unspecified trimester: Secondary | ICD-10-CM

## 2018-05-04 MED ORDER — NITROFURANTOIN MONOHYD MACRO 100 MG PO CAPS: 100.0000 mg | ORAL_CAPSULE | Freq: Two times a day (BID) | ORAL | 0 refills | Status: DC

## 2018-05-04 NOTE — Progress Notes (Signed)
   PRENATAL VISIT NOTE  Subjective:  Phyllis Lawson is a 33 y.o. G2P1000 at 52w1dbeing seen today for ongoing prenatal care.  She is currently monitored for the following issues for this low-risk pregnancy and has Supervision of other high risk pregnancy, antepartum on their problem list.  Patient reports no complaints.  Contractions: Not present. Vag. Bleeding: None.  Movement: Present. Denies leaking of fluid.   The following portions of the patient's history were reviewed and updated as appropriate: allergies, current medications, past family history, past medical history, past social history, past surgical history and problem list. Problem list updated.  Objective:   Vitals:   05/04/18 0906  BP: 119/60  Pulse: 90  Weight: 193 lb (87.5 kg)    Fetal Status: Fetal Heart Rate (bpm): 152   Movement: Present     General:  Alert, oriented and cooperative. Patient is in no acute distress.  Skin: Skin is warm and dry. No rash noted.   Cardiovascular: Normal heart rate noted  Respiratory: Normal respiratory effort, no problems with respiration noted  Abdomen: Soft, gravid, appropriate for gestational age.  Pain/Pressure: Absent     Pelvic: Cervical exam deferred        Extremities: Normal range of motion.  Edema: None  Mental Status: Normal mood and affect. Normal behavior. Normal judgment and thought content.   Assessment and Plan:  Pregnancy: G2P1000 at 273w1d1. Supervision of other high risk pregnancy, antepartum Pt's address was wrong in demographics.  Changed.  Deanna to call Baby Scripts and see if kit was sent.  Will check back in at 23 weeks to make sure she has kit and values are crossing.  Hgb Elect today AFP today Nml [place] NIPS  Pt's voicemail full--did not pick up Macrobid that Dr. DoHulan Frayrdered.  Pt will do so now and clear her voicemail.    Preterm labor symptoms and general obstetric precautions including but not limited to vaginal bleeding, contractions,  leaking of fluid and fetal movement were reviewed in detail with the patient. Please refer to After Visit Summary for other counseling recommendations.  No follow-ups on file.  No future appointments.  KeSilas SacramentoMD

## 2018-05-05 LAB — ALPHA FETOPROTEIN, MATERNAL
AFP MOM: 0.9
AFP, SERUM: 49.9 ng/mL
CALC'D GESTATIONAL AGE: 20.1 wk
MATERNAL WT: 186 [lb_av]
Risk for ONTD: <1
TWINS-AFP: 1

## 2018-05-06 LAB — HEMOGLOBINOPATHY EVALUATION
HGB C: 0 %
HGB S: 0 %
HGB VARIANT: 0 %
Hemoglobin A2 Quantitation: 2.1 % (ref 1.8–3.2)
Hemoglobin F Quantitation: 0 % (ref 0.0–2.0)
Hgb A: 97.9 % (ref 96.4–98.8)

## 2018-05-11 ENCOUNTER — Ambulatory Visit (HOSPITAL_COMMUNITY): Payer: 59

## 2018-06-03 ENCOUNTER — Other Ambulatory Visit: Payer: Self-pay

## 2018-06-03 DIAGNOSIS — O2340 Unspecified infection of urinary tract in pregnancy, unspecified trimester: Secondary | ICD-10-CM

## 2018-06-30 ENCOUNTER — Other Ambulatory Visit: Payer: Self-pay

## 2018-06-30 ENCOUNTER — Encounter: Payer: Self-pay | Admitting: Certified Nurse Midwife

## 2018-06-30 ENCOUNTER — Ambulatory Visit (INDEPENDENT_AMBULATORY_CARE_PROVIDER_SITE_OTHER): Payer: 59 | Admitting: Certified Nurse Midwife

## 2018-06-30 ENCOUNTER — Encounter (INDEPENDENT_AMBULATORY_CARE_PROVIDER_SITE_OTHER): Payer: Medicaid Other | Admitting: *Deleted

## 2018-06-30 VITALS — BP 114/61 | HR 90 | Wt 201.0 lb

## 2018-06-30 DIAGNOSIS — Z3483 Encounter for supervision of other normal pregnancy, third trimester: Secondary | ICD-10-CM

## 2018-06-30 DIAGNOSIS — Z3481 Encounter for supervision of other normal pregnancy, first trimester: Secondary | ICD-10-CM

## 2018-06-30 DIAGNOSIS — Z3480 Encounter for supervision of other normal pregnancy, unspecified trimester: Secondary | ICD-10-CM

## 2018-06-30 DIAGNOSIS — Z3A28 28 weeks gestation of pregnancy: Secondary | ICD-10-CM

## 2018-06-30 DIAGNOSIS — Z23 Encounter for immunization: Secondary | ICD-10-CM | POA: Diagnosis not present

## 2018-06-30 NOTE — Patient Instructions (Signed)

## 2018-06-30 NOTE — Progress Notes (Signed)
   PRENATAL VISIT NOTE  Subjective:  Phyllis Lawson is a 33 y.o. G2P1001 at [redacted]w[redacted]d being seen today for ongoing prenatal care.  She is currently monitored for the following issues for this low-risk pregnancy and has Supervision of other high risk pregnancy, antepartum on their problem list.  Patient reports no complaints.  Contractions: Not present. Vag. Bleeding: None.  Movement: Present. Denies leaking of fluid.   The following portions of the patient's history were reviewed and updated as appropriate: allergies, current medications, past family history, past medical history, past social history, past surgical history and problem list.   Objective:   Vitals:   06/30/18 0836  BP: 114/61  Pulse: 90  Weight: 201 lb (91.2 kg)    Fetal Status: Fetal Heart Rate (bpm): 153 Fundal Height: 29 cm Movement: Present     General:  Alert, oriented and cooperative. Patient is in no acute distress.  Skin: Skin is warm and dry. No rash noted.   Cardiovascular: Normal heart rate noted  Respiratory: Normal respiratory effort, no problems with respiration noted  Abdomen: Soft, gravid, appropriate for gestational age.  Pain/Pressure: Absent     Pelvic: Cervical exam deferred        Extremities: Normal range of motion.  Edema: Trace  Mental Status: Normal mood and affect. Normal behavior. Normal judgment and thought content.   Assessment and Plan:  Pregnancy: G2P1000 at [redacted]w[redacted]d 1. Supervision of other normal pregnancy, antepartum - Patient doing well, no complaints - Set patient up with babyscripts app and BP cuff prior to leaving office, discussed importance of entering BP weekly into app for monitoring  - Routine prenatal care - Anticipatory guidance on upcoming appointments with next being WebEx appointment, encouraged to download app on phone prior to next appointment  - Babyscripts Schedule Optimization - 2Hr GTT w/ 1 Hr Carpenter 75 g - HIV antibody (with reflex) - CBC - RPR  Preterm  labor symptoms and general obstetric precautions including but not limited to vaginal bleeding, contractions, leaking of fluid and fetal movement were reviewed in detail with the patient. Please refer to After Visit Summary for other counseling recommendations.   Return in about 4 weeks (around 07/28/2018) for ROB-WEBEX.  Future Appointments  Date Time Provider [place]  07/07/2018  8:30 AM [facility]  07/31/2018  8:30 AM Wende Mott, [place], CNM

## 2018-07-07 ENCOUNTER — Other Ambulatory Visit: Payer: Self-pay

## 2018-07-07 ENCOUNTER — Other Ambulatory Visit: Payer: 59

## 2018-07-08 LAB — HIV ANTIBODY (ROUTINE TESTING W REFLEX): HIV 1&2 Ab, 4th Generation: NONREACTIVE

## 2018-07-08 LAB — 2HR GTT W 1 HR, CARPENTER, 75 G
Glucose, 1 Hr, Gest: 96 mg/dL (ref 65–179)
Glucose, 2 Hr, Gest: 105 mg/dL (ref 65–152)
Glucose, Fasting, Gest: 78 mg/dL (ref 65–91)

## 2018-07-08 LAB — CBC
HCT: 33.4 % — ABNORMAL LOW (ref 35.0–45.0)
Hemoglobin: 11 g/dL — ABNORMAL LOW (ref 11.7–15.5)
MCH: 27 pg (ref 27.0–33.0)
MCHC: 32.9 g/dL (ref 32.0–36.0)
MCV: 82.1 fL (ref 80.0–100.0)
MPV: 11 fL (ref 7.5–12.5)
Platelets: 231 10*3/uL (ref 140–400)
RBC: 4.07 10*6/uL (ref 3.80–5.10)
RDW: 13.6 % (ref 11.0–15.0)
WBC: 12.1 10*3/uL — ABNORMAL HIGH (ref 3.8–10.8)

## 2018-07-08 LAB — RPR: RPR Ser Ql: NONREACTIVE

## 2018-07-31 ENCOUNTER — Ambulatory Visit (INDEPENDENT_AMBULATORY_CARE_PROVIDER_SITE_OTHER): Payer: 59

## 2018-07-31 ENCOUNTER — Other Ambulatory Visit: Payer: Self-pay

## 2018-07-31 VITALS — BP 112/61 | HR 92 | Wt 207.0 lb

## 2018-07-31 DIAGNOSIS — Z3483 Encounter for supervision of other normal pregnancy, third trimester: Secondary | ICD-10-CM

## 2018-07-31 DIAGNOSIS — Z3A32 32 weeks gestation of pregnancy: Secondary | ICD-10-CM | POA: Diagnosis not present

## 2018-07-31 DIAGNOSIS — Z3480 Encounter for supervision of other normal pregnancy, unspecified trimester: Secondary | ICD-10-CM

## 2018-07-31 NOTE — Progress Notes (Signed)
   TELEHEALTH VIRTUAL OBSTETRICS VISIT ENCOUNTER NOTE  I connected with Phyllis Lawson on 07/31/18 at  8:30 AM EDT by WebEx at home and verified that I am speaking with the correct person using two identifiers.   I discussed the limitations, risks, security and privacy concerns of performing an evaluation and management service by telephone and the availability of in person appointments. I also discussed with the patient that there may be a patient responsible charge related to this service. The patient expressed understanding and agreed to proceed.  Subjective:  Phyllis Lawson is a 33 y.o. G2P1001 at [redacted]w[redacted]d being followed for ongoing prenatal care.  She is currently monitored for the following issues for this low-risk pregnancy and has Supervision of other high risk pregnancy, antepartum on their problem list.  Patient reports no complaints. Reports fetal movement. Denies any contractions, bleeding or leaking of fluid.   The following portions of the patient's history were reviewed and updated as appropriate: allergies, current medications, past family history, past medical history, past social history, past surgical history and problem list.   Objective:   General:  Alert, oriented and cooperative.   Mental Status: Normal mood and affect perceived. Normal judgment and thought content.  Rest of physical exam deferred due to type of encounter  Assessment and Plan:  Pregnancy: G2P1001 at [redacted]w[redacted]d 1. Supervision of other normal pregnancy, antepartum -Patient not with BP cuff and unable to check before visit. Reiterated importance of entering BPs in babyRx -Reviewed labs from last visit with patient -Patient requesting to start maternity leave. Discussed with patient reasons why and agreed together that work note with restrictions would suffice at this time. Will continue to evaluate as pregnancy goes on -Anticipatory guidance of next visit with GBS testing reviewed with patient  -Counseled patient on routine COVID-19 testing for all admission to inpatient units whether direct admit or from MAU/ED. Reviewed reasons for testing including identifying cases and protecting patients/staff from possible infection. Reassured patient that separation from newborn is not required for COVID+ tests in asymptomatic patients and that the plan of care will be created with Peds through shared decision-making. One support person is allowed for all admitted patients regardless of COVID test results. All patient questions answered.    Preterm labor symptoms and general obstetric precautions including but not limited to vaginal bleeding, contractions, leaking of fluid and fetal movement were reviewed in detail with the patient.  I discussed the assessment and treatment plan with the patient. The patient was provided an opportunity to ask questions and all were answered. The patient agreed with the plan and demonstrated an understanding of the instructions. The patient was advised to call back or seek an in-person office evaluation/go to [facility]U at [facility] for any urgent or concerning symptoms. Please refer to After Visit Summary for other counseling recommendations.   I provided 10 minutes of non-face-to-face time during this encounter.  Return in about 4 weeks (around 08/28/2018) for Return OB visit/GBS.  Future Appointments  Date Time Provider [place]  07/31/2018  8:30 AM Wende Mott, [place], [place] for [employer], [place]

## 2018-08-25 ENCOUNTER — Other Ambulatory Visit: Payer: Self-pay

## 2018-08-25 ENCOUNTER — Other Ambulatory Visit (HOSPITAL_COMMUNITY)
Admission: RE | Admit: 2018-08-25 | Discharge: 2018-08-25 | Disposition: A | Discharge status: Home / Self Care | Payer: 59 | Source: Ambulatory Visit | Attending: Certified Nurse Midwife | Admitting: Certified Nurse Midwife

## 2018-08-25 ENCOUNTER — Encounter: Payer: Self-pay | Admitting: Certified Nurse Midwife

## 2018-08-25 ENCOUNTER — Ambulatory Visit (INDEPENDENT_AMBULATORY_CARE_PROVIDER_SITE_OTHER): Payer: 59 | Admitting: Certified Nurse Midwife

## 2018-08-25 VITALS — BP 123/63 | HR 100 | Wt 209.0 lb

## 2018-08-25 DIAGNOSIS — Z3A36 36 weeks gestation of pregnancy: Secondary | ICD-10-CM

## 2018-08-25 DIAGNOSIS — Z3480 Encounter for supervision of other normal pregnancy, unspecified trimester: Secondary | ICD-10-CM

## 2018-08-25 DIAGNOSIS — Z3483 Encounter for supervision of other normal pregnancy, third trimester: Secondary | ICD-10-CM

## 2018-08-25 LAB — OB RESULTS CONSOLE GBS: GBS: NEGATIVE

## 2018-08-25 LAB — OB RESULTS CONSOLE GC/CHLAMYDIA: Gonorrhea: NEGATIVE

## 2018-08-25 MED ORDER — PRENATAL VITAMIN 27-0.8 MG PO TABS: 1.0000 | ORAL_TABLET | Freq: Every day | ORAL | 2 refills | Status: AC

## 2018-08-25 NOTE — Patient Instructions (Addendum)
Reasons to go to MAU:  1.  Contractions are  5 minutes apart or less, each last 1 minute, these have been going on for 1-2 hours, and you cannot walk or talk during them 2.  You have a large gush of fluid, or a trickle of fluid that will not stop and you have to wear a pad 3.  You have bleeding that is bright red, heavier than spotting--like menstrual bleeding (spotting can be normal in early labor or after a check of your cervix) 4.  You do not feel the baby moving like he/she normally does  [place] [phone]) . [facility] Davy Pique, MD; Gwendlyn Deutscher, MD; Walker Kehr, MD; Andria Frames, MD; McDiarmid, MD; Dutch Quint, MD; Nori Riis, MD; Mingo Amber, [address] [place], [place] [address] o [phone] o Mon-Fri 8:30-12:30, 1:30-5:00 o Providers come to see babies at [facility] o Accepting Medicaid . [place] at [place] providers who accept newborns: Dorthy Cooler, MD; Orland Mustard, MD; Stephanie Acre, MD o [place], [place], [place] [address] o [phone] o Mon-Fri 8:00-5:30 o Babies seen by providers at [facility] o Does NOT accept Medicaid o Please call early in hospitalization for appointment (limited availability)  . Mustard Butler, MD o [address], [place], [place] [address] o [phone] o Mon, Tue, Thur, Fri 8:30-5:00, Wed 10:00-7:00 (closed 1-2pm) o Babies seen by [facility] providers o Accepting Medicaid . Shirley, MD o [place], [place], [place] [address] o [phone] o Mon-Fri 8:30-5:00, Sat 8:30-12:00 o Provider comes to see babies at [place] Medicaid o Must have been referred from current patients or contacted office prior to delivery . [place] [facility] ([place] for [place]) Franne Forts, MD; Tamera Punt, MD; Doneen Poisson,  MD; Fatima Sanger, MD; Wynetta Emery, MD; Jess Barters, MD; Tami Ribas, MD; Herbert Moors, MD; Derrell Lolling, MD; Dorothyann Peng, MD; Lucious Groves, NP; Baldo Ash, NP o [address], [place], [place] [address] o [phone] o Mon, Tue, Thur, Fri 8:30-5:30, Wed 9:30-5:30, Sat 8:30-12:30 o Babies seen by [facility] providers o Accepting Medicaid o Only accepting infants of first-time parents or siblings of current patients [facility] discharge coordinator will make follow-up appointment . Baltazar Najjar o [place] [address], [place], [place]  [address] o [phone]   Fax - [phone] . [facility] o [address], [place], [place]  [address] o Phone - [phone]   Fax [phone] . [place], [place], [place], [place]  [address] o [phone]  [place] [phone]) . [facility] Reginal Lutes, MD; Jacklynn Ganong, MD; Torrie Mayers, MD; MD; Rosana Hoes, MD; Servando Salina, MD; Rose Fillers, MD; Rex Kras, MD; Corinna Capra, MD; Volney American, MD; Trilby Drummer, MD; Janann Colonel, MD; Jimmye Norman, [place], [place], [place] [address] o [phone] o Mon-Fri 8:30-5:00 (extended evenings Mon-Thur as needed), Sat-Sun 10:00-1:00 o Providers come to see babies at [place] Medicaid for families of first-time babies and families with all children in the household age 60 and under. Must register with office prior to making appointment (M-F only). . [place], NP; Tomi Bamberger, MD; Redmond School, MD; [place], [place]., [place],  [address] o [phone] o Mon-Fri 8:00-5:00 o Babies seen by providers at [facility] o Does NOT accept Medicaid/Commercial Insurance Only . [facility] at [employer] (  [facility])  Marnee Guarneri, MD; Drema Dallas, MD; Montine Circle, MD; Vilma Prader, MD; Vanita Panda, MD; Alfonso Ramus, MD; Ruthann Cancer, MD; Roxanne Mins, MD; Rosalva Ferron, MD; Polly Cobia, MD o [address] [place], [place] [address] o [phone] o Mon-Fri  8:30-5:30, Sat (Oct.-Mar.) 9:00-1:00 o Babies seen by providers at [place] [phone]) . [facility] Peers, MD; Suzan Slick, MD o [place] [address], [place], [place] [address] o [phone] o Mon-Fri 8:30-5:00, Sat 8:30-12:00 o Providers come to see babies at [facility] o Does NOT accept Medicaid . [place] at [place], [place]; [place], MD; [place], [place]; Nancy Fetter, MD; Moreen Fowler, [place], [place], [place] [address] o [phone] o Mon-Fri 8:00-5:00 o Babies seen by providers at [facility] o Does NOT accept Medicaid o Only accepting babies of parents who are patients o Please call early in hospitalization for appointment (limited availability) . [facility] Pediatricians Blanca Friend, MD; Sharlene Motts, MD; Rod Can, MD; Warner Mccreedy, NP; Sabra Heck, MD; Ermalinda Memos, MD; Sharlett Iles, NP; Aurther Loft, MD; Jerrye Beavers, MD; Marcello Moores, MD; Berline Lopes, MD; Charolette Forward, MD o [place]. [place], [place], [place] [address] o [phone] o Mon-Fri 8:00-5:00, Sat 9:00-12:00 o Providers come to see babies at [facility] o Does NOT accept [facility] [phone]) . [place] providers accepting new patients: Dayna Ramus, NP; [place], [place], [place], [place] [address] o [phone] o Mon-Fri 8:00-5:00 o Babies seen by providers at [facility] o Does NOT accept Medicaid o Only accepting babies of parents who are patients o Please call early in hospitalization for appointment (limited availability) . [facility] Oswaldo Conroy, MD; Sheran Lawless, MD o [address] [place], [place] [address] o [phone] (press 1 to schedule appointment) o Mon-Fri 8:00-5:00 o Providers come to see babies at [facility] o Does NOT accept Medicaid . [facility] Jodi Mourning, MD o [address], [place], [place] [address] o [phone] o Mon-Fri 8:30-5:00 (lunch  12:30-1:00), extended hours by appointment only Wed 5:00-6:30 o Babies seen by [facility] providers o Accepting Medicaid . [place] at Evalyn Casco, MD; [place], MD; Ethlyn Gallery, MD o [place], [place], [place] [address] o [phone] o Mon-Fri 8:00-5:00 o Babies seen by [facility] providers o Does NOT accept Medicaid . Therapist, music at [place], MD; Yong Channel, MD; [place], [address] [place], [place] [address] o [phone] o Mon-Fri 8:00-5:00 o Babies seen by [facility] providers o Does NOT accept Medicaid . [place], [place]; [place], [place]; [place], NP; Albertina Parr, MD; Frederic Jericho, MD; Ronney Lion, MD; Carlos Levering, NP; Jerelene Redden, NP; Tomasita Crumble, NP; Ronelle Nigh, NP; Corinna Lines, MD; Mo[place], MD o [address] [place], [place] [address] o [phone] o Mon-Fri 8:30-5:00, Sat 10:00-1:00 o Providers come to see babies at [facility] o Does NOT accept Medicaid o Free prenatal information session Tuesdays at 4:45pm . [facility] Porfirio Oar, MD; [place], [place]; [place], [place]; [place], [place]., [place] [address] o [phone] o Mon-Fri 7:30-5:30 o Babies seen by [facility] providers . [facility] o [address], [place], [place], [place]  [address] o [phone]   Fax - [phone]  [place] [phone] & [phone]) . [place] [address] [place], [place] [address] o [phone] o Mon-Thur 8:00-6:00 o Providers come to see babies at [place] Medicaid .  [place], NP; Melford Aase, MD; [place], [place]; [place], [place]., [place], [place] [address] o [phone] o Mon-Thur 7:30-7:30, Fri 7:30-4:30 o Babies seen by [facility] providers o Accepting Medicaid . [facility] Nyra Jabs, MD; Cristino Martes, NP; Gertie Baron, MD o [place] [address], [place], [place] [address] o [phone] o Mon-Fri 8:30-5:00, Sat 8:30-12:00 o Providers come to see babies at [place] Medicaid o Must have "Meet & Greet" appointment at office prior to delivery . [place] ([place]) Jodene Nam, MD; Juleen China, MD; Clydene Laming, [place], [place], [place] [address] o [phone] o Mon-Wed 8:00-6:00, Thur-Fri 8:00-5:00, Sat 9:00-12:00 o Providers come to see babies at [facility] o Does NOT accept Medicaid o Only accepting siblings of current patients . [facility] [place], [place], [place], [place]  [address] o [phone]   Fax [phone] . [place] at [place] [address] [place], [place]  [address] o [phone]   Fax - [place] [phone] & [phone]) . Therapist, music at [place], DO; [place], [place]., [place], [place] [address] o [phone] o Mon-Fri 7:00-5:00 o Babies seen by [facility] providers o Does NOT accept Medicaid . [place], MD; [place], [place]; [place], [place], [place], [place] [address] o [phone] o Mon-Fri 8:00-5:00 o Babies seen by [facility] providers o Accepting Medicaid . [place], MD; [place], [place]; [place], NP; [place], [place], [place], [place] [address] o [phone] o Mon-Fri 8:00-5:00 o Babies seen by providers at [place] [phone]) . [facility] [place], [place] o [address]., [place], [place] [address] o [phone] o Mon-Fri 8:00-5:00 o Babies seen by [facility] providers o Does NOT accept Medicaid o Limited availability, please call  early in hospitalization to schedule follow-up . [facility] Leilani Merl, PA; Maisie Fus, MD; [place], MD; [place], [place]; Jeannine Kitten, MD; [place], [place] [facility] [address], [place], [place] [address] o [phone] o Mon-Fri 8:30-5:00, Sat 9:00-12:00 o Babies seen by providers at [facility] o Accepting Medicaid o Please register online then schedule online or call office o [email or web address] . [place] ([place] [facility] [place]) Kristian Covey, NP; Dwyane Dee, MD; Leonidas Romberg, PA o [address] [place], [place], [place] [address] o [phone] o Mon-Fri 8:00-5:00 o Babies seen by providers at [facility] o Accepting Medicaid . [place] ([facility] [employer]) Dairl Ponder, MD; Rayvon Char, NP; Melina Modena, MD o [address] [place], [place], [place] [address] o [phone] o Mon-Fri 8:00-5:30, Sat&Sun by appointment (phones open at 8:30) o Babies seen by [facility] providers o Accepting Medicaid o Must be a first-time baby or sibling of current patient . [place], [address], [place], [place]  [address] o [phone]   Fax - [phone]  [place] [phone] & [phone]) . [place], [place]; [place], [place]; Benjamine Mola, MD; [place], [place]; Harrell Lark, MD o [address], [place], [place] [address] o [phone] o Mon-Thur 8:00-7:00, Fri 8:00-5:00, Sat 8:00-12:00,  Sun 9:00-12:00 o Babies seen by [facility] providers o Accepting Medicaid . [facility] at [facility] Lumpkin, MD; Ruthann Cancer, MD; [facility] We[place], MD o [address], [place], [place] [address] o [phone] o Mon-Thur 8:00-5:00 o Babies seen by providers at [facility] o Accepting Medicaid . [facility] at [place], MD; Coe[place], MD; Amedeo Plenty, MD; Bobby Rumpf, MD; [place], MD; [place] Drafts, MD; Ruthann Cancer,  MD; Selinda Eon, MD; Audie Box, MD; Jim Like, MD; Christie Nottingham, MD; [place] Hartshorn, MD; Modena Nunnery, MD o [address] [place], [place] [address] o [phone] o Mon-Fri 8:00-5:30, Sat (Oct.-Mar.) 9:00-1:00 o Babies seen by providers at [facility] o Accepting Medicaid o Must fill out new patient packet, available online at [email or web address] . [place] ([facility]) Barnabas Lister, NP; [place] Kingfisher, NP; Claiborne Billings, NP; Rolla Plate, MD; [employer], [place]; Carola Rhine, MD; Tyron Russell, MD; Delia Chimes, NP o [address], [place], [place] [address] o [phone] o Mon-Thur 8:00-5:30, Fri 8:00-5:00 o Babies seen by providers at [place] [phone]) . [place], [place]; [place], MD; Dennard Schaumann, MD; [place], [place] o [address] [place], [place] [address] o [phone] o Mon-Fri 8:00-5:00 o Babies seen by providers at [place] [phone]) . [place] at [place], [place]; Olen Pel, MD; [place], [place], [place], [place] [address] o [phone] o Mon-Fri 8:00-5:00 o Babies seen by providers at [facility] o Does NOT accept Medicaid o Limited appointment availability, please call early in hospitalization  . Therapist, music at [place], [place]; [place], [address] [place], [place] [address] o [phone] o Mon-Fri 8:00-5:00 o Babies seen by [facility] providers o Does NOT accept Medicaid . [facility] Su Grand, MD; Guy Sandifer, MD; [place], [place]; [place], [place] [address], [place], [place] [address] o [phone] o Mon-Fri 8:00-5:00 o After hours clinic [facility] [place], [place] [address]) [phone] Mon-Fri 5:00-8:00, Sat 12:00-6:00, Sun 10:00-4:00 o Babies seen by [facility] providers o Accepting Medicaid . [place] at [facility] o [address] [place], [place]  [address] o [phone]   Fax - [phone]  [place] [phone]) . Therapist, music at [facility], MD o [address] [place], [place], [place] [address] o [phone] o Mon-Fri 8:00-5:00 o Babies seen by [facility] providers o Does NOT accept Medicaid . [place] ([place] [facility] [place]) Bing Neighbors, MD o [address] [place], [place], [place] [address] o [phone] o Mon-Thur 8:00-7:00, Fri 8:00-5:00, Sat 8:00-12:00 o Babies seen by providers at [facility] o Accepting Medicaid - but does not have vaccinations in office (must be received elsewhere) o Limited availability, please call early in hospitalization   ([phone]) . [place], [place] [address], [place] [address] o [phone]  Fax [phone]

## 2018-08-25 NOTE — Progress Notes (Signed)
   PRENATAL VISIT NOTE  Subjective:  Phyllis Lawson is a 33 y.o. G2P1001 at [redacted]w[redacted]d being seen today for ongoing prenatal care.  She is currently monitored for the following issues for this low-risk pregnancy and has Supervision of other normal pregnancy, antepartum on their problem list.  Patient reports no complaints.  Contractions: Irritability. Vag. Bleeding: None.  Movement: Present. Denies leaking of fluid.   The following portions of the patient's history were reviewed and updated as appropriate: allergies, current medications, past family history, past medical history, past social history, past surgical history and problem list.   Objective:   Vitals:   08/25/18 0847  BP: 123/63  Pulse: 100  Weight: 209 lb (94.8 kg)    Fetal Status: Fetal Heart Rate (bpm): 153 Fundal Height: 38 cm Movement: Present  Presentation: Vertex  General:  Alert, oriented and cooperative. Patient is in no acute distress.  Skin: Skin is warm and dry. No rash noted.   Cardiovascular: Normal heart rate noted  Respiratory: Normal respiratory effort, no problems with respiration noted  Abdomen: Soft, gravid, appropriate for gestational age.  Pain/Pressure: Present     Pelvic: Cervical exam performed Dilation: Closed Effacement (%): 50 Station: -3  Extremities: Normal range of motion.  Edema: Mild pitting, slight indentation  Mental Status: Normal mood and affect. Normal behavior. Normal judgment and thought content.   Assessment and Plan:  Pregnancy: G2P1001 at [redacted]w[redacted]d 1. Supervision of other normal pregnancy, antepartum - Patient doing well, no complaints, "ready for baby to come"  - Routine prenatal care - Anticipatory guidance on upcoming appointments with [place] appointment in 2 weeks and in person appointment at 29 weeks  - Labor precautions discussed   - List of pediatricians given to patient  - Culture, beta strep (group b only) - Cervicovaginal ancillary only( [place]) - Prenatal Vit-Fe  Fumarate-FA (PRENATAL VITAMIN) 27-0.8 MG TABS; Take 1 tablet by mouth daily.  Dispense: 30 tablet; Refill: 2  Preterm labor symptoms and general obstetric precautions including but not limited to vaginal bleeding, contractions, leaking of fluid and fetal movement were reviewed in detail with the patient. Please refer to After Visit Summary for other counseling recommendations.   Return in about 2 weeks (around 09/08/2018) for ROB-mychart visit .  Future Appointments  Date Time Provider [place]  09/08/2018  9:45 AM Lajean Manes, [place], CNM

## 2018-08-26 LAB — CERVICOVAGINAL ANCILLARY ONLY
Chlamydia: NEGATIVE
Neisseria Gonorrhea: NEGATIVE

## 2018-08-28 LAB — CULTURE, BETA STREP (GROUP B ONLY)
MICRO NUMBER:: 601000
SPECIMEN QUALITY:: ADEQUATE

## 2018-09-08 ENCOUNTER — Encounter (HOSPITAL_COMMUNITY): Payer: Self-pay

## 2018-09-08 ENCOUNTER — Other Ambulatory Visit: Payer: Self-pay

## 2018-09-08 ENCOUNTER — Inpatient Hospital Stay (HOSPITAL_COMMUNITY)
Admission: AD | Admit: 2018-09-08 | Discharge: 2018-09-10 | DRG: 798 | Disposition: A | Discharge status: Home / Self Care | Payer: 59 | Attending: Family Medicine | Admitting: Family Medicine

## 2018-09-08 ENCOUNTER — Telehealth: Payer: 59 | Admitting: Certified Nurse Midwife

## 2018-09-08 DIAGNOSIS — Z3480 Encounter for supervision of other normal pregnancy, unspecified trimester: Secondary | ICD-10-CM

## 2018-09-08 DIAGNOSIS — Z9851 Tubal ligation status: Secondary | ICD-10-CM

## 2018-09-08 DIAGNOSIS — D252 Subserosal leiomyoma of uterus: Secondary | ICD-10-CM | POA: Diagnosis present

## 2018-09-08 DIAGNOSIS — O26893 Other specified pregnancy related conditions, third trimester: Secondary | ICD-10-CM | POA: Diagnosis present

## 2018-09-08 DIAGNOSIS — O3413 Maternal care for benign tumor of corpus uteri, third trimester: Principal | ICD-10-CM | POA: Diagnosis present

## 2018-09-08 DIAGNOSIS — Z302 Encounter for sterilization: Secondary | ICD-10-CM | POA: Diagnosis not present

## 2018-09-08 DIAGNOSIS — Z3A38 38 weeks gestation of pregnancy: Secondary | ICD-10-CM | POA: Diagnosis not present

## 2018-09-08 DIAGNOSIS — Z1159 Encounter for screening for other viral diseases: Secondary | ICD-10-CM | POA: Diagnosis not present

## 2018-09-08 LAB — SARS CORONAVIRUS 2 BY RT PCR (HOSPITAL ORDER, PERFORMED IN ~~LOC~~ HOSPITAL LAB): SARS Coronavirus 2: NEGATIVE

## 2018-09-08 LAB — ABO/RH: ABO/RH(D): O POS

## 2018-09-08 LAB — CBC
HCT: 36 % (ref 36.0–46.0)
Hemoglobin: 11.3 g/dL — ABNORMAL LOW (ref 12.0–15.0)
MCH: 26.4 pg (ref 26.0–34.0)
MCHC: 31.4 g/dL (ref 30.0–36.0)
MCV: 84.1 fL (ref 80.0–100.0)
Platelets: 222 10*3/uL (ref 150–400)
RBC: 4.28 MIL/uL (ref 3.87–5.11)
RDW: 14.4 % (ref 11.5–15.5)
WBC: 12.7 10*3/uL — ABNORMAL HIGH (ref 4.0–10.5)
nRBC: 0 % (ref 0.0–0.2)

## 2018-09-08 LAB — TYPE AND SCREEN
ABO/RH(D): O POS
Antibody Screen: NEGATIVE

## 2018-09-08 MED ORDER — OXYCODONE-ACETAMINOPHEN 5-325 MG PO TABS: 1.0000 | ORAL_TABLET | ORAL | Status: DC | PRN

## 2018-09-08 MED ORDER — PRENATAL MULTIVITAMIN CH
1.0000 | ORAL_TABLET | Freq: Every day | ORAL | Status: DC
  Administered 2018-09-10: 1 via ORAL
  Filled 2018-09-08: qty 1

## 2018-09-08 MED ORDER — DIPHENHYDRAMINE HCL 25 MG PO CAPS: 25.0000 mg | ORAL_CAPSULE | Freq: Four times a day (QID) | ORAL | Status: DC | PRN

## 2018-09-08 MED ORDER — FENTANYL CITRATE (PF) 100 MCG/2ML IJ SOLN: 100.0000 ug | INTRAMUSCULAR | Status: DC | PRN

## 2018-09-08 MED ORDER — TETANUS-DIPHTH-ACELL PERTUSSIS 5-2.5-18.5 LF-MCG/0.5 IM SUSP: 0.5000 mL | Freq: Once | INTRAMUSCULAR | Status: DC

## 2018-09-08 MED ORDER — MEASLES, MUMPS & RUBELLA VAC IJ SOLR: 0.5000 mL | Freq: Once | INTRAMUSCULAR | Status: DC

## 2018-09-08 MED ORDER — EPHEDRINE 5 MG/ML INJ: 10.0000 mg | INTRAVENOUS | Status: DC | PRN

## 2018-09-08 MED ORDER — ONDANSETRON HCL 4 MG PO TABS: 4.0000 mg | ORAL_TABLET | ORAL | Status: DC | PRN

## 2018-09-08 MED ORDER — DIPHENHYDRAMINE HCL 50 MG/ML IJ SOLN: 12.5000 mg | INTRAMUSCULAR | Status: DC | PRN

## 2018-09-08 MED ORDER — ACETAMINOPHEN 325 MG PO TABS
650.0000 mg | ORAL_TABLET | ORAL | Status: DC | PRN
  Administered 2018-09-10: 650 mg via ORAL
  Filled 2018-09-08: qty 2

## 2018-09-08 MED ORDER — LACTATED RINGERS IV SOLN
INTRAVENOUS | Status: DC
  Administered 2018-09-08 (×3): via INTRAVENOUS

## 2018-09-08 MED ORDER — SOD CITRATE-CITRIC ACID 500-334 MG/5ML PO SOLN: 30.0000 mL | ORAL | Status: DC | PRN

## 2018-09-08 MED ORDER — PHENYLEPHRINE 40 MCG/ML (10ML) SYRINGE FOR IV PUSH (FOR BLOOD PRESSURE SUPPORT): 80.0000 ug | PREFILLED_SYRINGE | INTRAVENOUS | Status: DC | PRN

## 2018-09-08 MED ORDER — OXYTOCIN BOLUS FROM INFUSION
500.0000 mL | Freq: Once | INTRAVENOUS | Status: AC
  Administered 2018-09-08: 20:00:00 500 mL via INTRAVENOUS

## 2018-09-08 MED ORDER — TERBUTALINE SULFATE 1 MG/ML IJ SOLN: 0.2500 mg | Freq: Once | INTRAMUSCULAR | Status: DC | PRN

## 2018-09-08 MED ORDER — SENNOSIDES-DOCUSATE SODIUM 8.6-50 MG PO TABS
2.0000 | ORAL_TABLET | ORAL | Status: DC
  Administered 2018-09-08 – 2018-09-10 (×2): 2 via ORAL
  Filled 2018-09-08 (×2): qty 2

## 2018-09-08 MED ORDER — IBUPROFEN 600 MG PO TABS
600.0000 mg | ORAL_TABLET | Freq: Four times a day (QID) | ORAL | Status: DC
  Administered 2018-09-08 – 2018-09-10 (×5): 600 mg via ORAL
  Filled 2018-09-08 (×6): qty 1

## 2018-09-08 MED ORDER — LACTATED RINGERS IV SOLN: 500.0000 mL | INTRAVENOUS | Status: DC | PRN

## 2018-09-08 MED ORDER — ONDANSETRON HCL 4 MG/2ML IJ SOLN: 4.0000 mg | Freq: Four times a day (QID) | INTRAMUSCULAR | Status: DC | PRN

## 2018-09-08 MED ORDER — SODIUM CHLORIDE 0.9 % IV SOLN: 250.0000 mL | INTRAVENOUS | Status: DC | PRN

## 2018-09-08 MED ORDER — COCONUT OIL OIL: 1.0000 "application " | TOPICAL_OIL | Status: DC | PRN

## 2018-09-08 MED ORDER — OXYTOCIN 40 UNITS IN NORMAL SALINE INFUSION - SIMPLE MED
2.5000 [IU]/h | INTRAVENOUS | Status: DC
  Filled 2018-09-08: qty 1000

## 2018-09-08 MED ORDER — ACETAMINOPHEN 325 MG PO TABS: 650.0000 mg | ORAL_TABLET | ORAL | Status: DC | PRN

## 2018-09-08 MED ORDER — SODIUM CHLORIDE 0.9% FLUSH
3.0000 mL | Freq: Two times a day (BID) | INTRAVENOUS | Status: DC
  Administered 2018-09-09: 10:00:00 3 mL via INTRAVENOUS

## 2018-09-08 MED ORDER — OXYCODONE-ACETAMINOPHEN 5-325 MG PO TABS: 2.0000 | ORAL_TABLET | ORAL | Status: DC | PRN

## 2018-09-08 MED ORDER — SIMETHICONE 80 MG PO CHEW: 80.0000 mg | CHEWABLE_TABLET | ORAL | Status: DC | PRN

## 2018-09-08 MED ORDER — ONDANSETRON HCL 4 MG/2ML IJ SOLN: 4.0000 mg | INTRAMUSCULAR | Status: DC | PRN

## 2018-09-08 MED ORDER — LIDOCAINE HCL (PF) 1 % IJ SOLN: 30.0000 mL | INTRAMUSCULAR | Status: DC | PRN

## 2018-09-08 MED ORDER — FENTANYL-BUPIVACAINE-NACL 0.5-0.125-0.9 MG/250ML-% EP SOLN: 12.0000 mL/h | EPIDURAL | Status: DC | PRN

## 2018-09-08 MED ORDER — DIBUCAINE (PERIANAL) 1 % EX OINT: 1.0000 "application " | TOPICAL_OINTMENT | CUTANEOUS | Status: DC | PRN

## 2018-09-08 MED ORDER — BENZOCAINE-MENTHOL 20-0.5 % EX AERO: 1.0000 "application " | INHALATION_SPRAY | CUTANEOUS | Status: DC | PRN

## 2018-09-08 MED ORDER — SODIUM CHLORIDE 0.9% FLUSH: 3.0000 mL | INTRAVENOUS | Status: DC | PRN

## 2018-09-08 MED ORDER — OXYTOCIN 40 UNITS IN NORMAL SALINE INFUSION - SIMPLE MED
1.0000 m[IU]/min | INTRAVENOUS | Status: DC
  Administered 2018-09-08: 2 m[IU]/min via INTRAVENOUS

## 2018-09-08 MED ORDER — WITCH HAZEL-GLYCERIN EX PADS: 1.0000 "application " | MEDICATED_PAD | CUTANEOUS | Status: DC | PRN

## 2018-09-08 MED ORDER — ZOLPIDEM TARTRATE 5 MG PO TABS: 5.0000 mg | ORAL_TABLET | Freq: Every evening | ORAL | Status: DC | PRN

## 2018-09-08 MED ORDER — LACTATED RINGERS IV SOLN: 500.0000 mL | Freq: Once | INTRAVENOUS | Status: DC

## 2018-09-08 NOTE — H&P (Addendum)
LABOR AND DELIVERY ADMISSION HISTORY AND PHYSICAL NOTE  Phyllis Lawson is a 33 y.o. female G2P1001 with IUP at [redacted]w[redacted]d by early ultrasound presenting for SOL. Made cervical change in MAU. She reports positive fetal movement. She denies leakage of fluid or vaginal bleeding. No issues with prior pregnancy or delivery.   Prenatal History/Complications: PNC at [facility]  Pregnancy complications:  - None   Past Medical History: Past Medical History:  Diagnosis Date  . Medical history non-contributory     Past Surgical History: History reviewed. No pertinent surgical history.  Obstetrical History: OB History    Gravida  2   Para  1   Term  1   Preterm      AB      Living  1     SAB      TAB      Ectopic      Multiple      Live Births  1           Social History: Social History   Socioeconomic History  . Marital status: Single    Spouse name: Not on file  . Number of children: Not on file  . Years of education: Not on file  . Highest education level: Not on file  Occupational History  . Not on file  Social Needs  . Financial resource strain: Not on file  . Food insecurity    Worry: Not on file    Inability: Not on file  . Transportation needs    Medical: Not on file    Non-medical: Not on file  Tobacco Use  . Smoking status: Never Smoker  . Smokeless tobacco: Never Used  Substance and Sexual Activity  . Alcohol use: Not Currently    Comment: occ  . Drug use: No  . Sexual activity: Yes    Birth control/protection: None  Lifestyle  . Physical activity    Days per week: Not on file    Minutes per session: Not on file  . Stress: Not on file  Relationships  . Social Herbalist on phone: Not on file    Gets together: Not on file    Attends religious service: Not on file    Active member of club or organization: Not on file    Attends meetings of clubs or organizations: Not on file    Relationship status: Not on file  Other Topics  Concern  . Not on file  Social History Narrative  . Not on file    Family History: Family History  Problem Relation Age of Onset  . Hypertension Mother     Allergies: No Known Allergies  Medications Prior to Admission  Medication Sig Dispense Refill Last Dose  . Prenatal Vit-Fe Fumarate-FA (PRENATAL VITAMIN) 27-0.8 MG TABS Take 1 tablet by mouth daily. 30 tablet 2      Review of Systems  All systems reviewed and negative except as stated in HPI  Physical Exam Blood pressure 125/71, pulse 97, temperature 98.3 F (36.8 C), temperature source Oral, resp. rate 18, height 5\' 7"  (1.702 m), weight 95.6 kg, last menstrual period 12/29/2017, SpO2 99 %. General appearance: alert, oriented, NAD Lungs: normal respiratory effort Heart: regular rate Abdomen: soft, non-tender; gravid, FH appropriate for GA Extremities: No calf swelling or tenderness Presentation: cephalic Fetal monitoring: 150 bpm, moderate variability, +acels, no decels  Uterine activity: q1-4 min  Dilation: 6 Effacement (%): 80 Station: -2 Exam by:: jolynn rn  Prenatal  labs: ABO, Rh: O/RH(D) POSITIVE/-- (01/13 0949) Antibody: NO ANTIBODIES DETECTED (01/13 0949) Rubella: 0.96 (01/13 0949) RPR: NON-REACTIVE (05/05 9758)  HBsAg: NON-REACTIVE (01/13 0949)  HIV: NON-REACTIVE (05/05 0822)  GC/Chlamydia: Negative  GBS:   Negative  2-hr GTT: Normal  Genetic screening:  Normal  Anatomy US: Normal   Prenatal Transfer Tool  Maternal Diabetes: No Genetic Screening: Normal Maternal Ultrasounds/Referrals: Normal Fetal Ultrasounds or other Referrals:  None Maternal Substance Abuse:  No Significant Maternal Medications:  None Significant Maternal Lab Results: Group B Strep negative  No results found for this or any previous visit (from the past 24 hour(s)).  Patient Active Problem List   Diagnosis Date Noted  . Supervision of other normal pregnancy, antepartum 03/16/2018    Assessment: Phyllis Lawson is a  33 y.o. G2P1001 at [redacted]w[redacted]d here for SOL.   #Labor: Expectant management. Patient has been progressing spontaneously. AROM performed with mild amount of clear fluid due to contractions spacing out between MAU to L&D transfer.  #Pain: Declines epidural  #FWB: Cat I  #ID:  GBS neg  #MOF: Breast  #MOC:Considering BTL. Has not discussed at [facility] visits.  #Circ:  Inpatient   Melina Schools 09/08/2018, 9:51 AM

## 2018-09-08 NOTE — MAU Note (Signed)
Phyllis Lawson is a 32 y.o. at 106w2d here in MAU reporting: started having ctx last night, this morning they are every 5-10 min. Saw some bleeding this AM but none since, states she saw it in the toilet and then wiped and didn't see anymore. No LOF, no bleeding currently, +FM  Onset of complaint: last night  Pain score: 6/10  Vitals:   09/08/18 0823  BP: 125/71  Pulse: 97  Resp: 18  Temp: 98.3 F (36.8 C)  SpO2: 99%      Lab orders placed from triage: none

## 2018-09-08 NOTE — Progress Notes (Signed)
Verbal order given by Darrelyn Hillock, DO for pt to receive oral medications scheduled for midnight,  ordered NPO after midnight.  RN was informed pt is being scheduled for BTL tomorrow.

## 2018-09-08 NOTE — Progress Notes (Signed)
LABOR PROGRESS NOTE  Phyllis Lawson is a 33 y.o. G2P1001 at [redacted]w[redacted]d  admitted for SOL.   Subjective: Strip note. Discussed plan of care with RN.   Objective: BP 122/71   Pulse 89   Temp 98.3 F (36.8 C) (Oral)   Resp 18   Ht 5\' 7"  (1.702 m)   Wt 95.3 kg   LMP 12/29/2017 (LMP Unknown)   SpO2 99%   BMI 32.89 kg/m  or  Vitals:   09/08/18 1236 09/08/18 1321 09/08/18 1350 09/08/18 1437  BP: 115/60  112/70 122/71  Pulse: 93  97 89  Resp: 20 18 20 18   Temp:   98.3 F (36.8 C)   TempSrc:   Oral   SpO2:      Weight:      Height:        Dilation: 6 Effacement (%): 70 Cervical Position: Posterior Station: -3 Presentation: Vertex Exam by:: Dr. Juleen China FHT: baseline rate 140, moderate varibility, +acel, no decel Toco: q2-7 min   Labs: Lab Results  Component Value Date   WBC 12.7 (H) 09/08/2018   HGB 11.3 (L) 09/08/2018   HCT 36.0 09/08/2018   MCV 84.1 09/08/2018   PLT 222 09/08/2018    Patient Active Problem List   Diagnosis Date Noted  . Indication for care in labor or delivery 09/08/2018  . Supervision of other normal pregnancy, antepartum 03/16/2018    Assessment / Plan: 33 y.o. G2P1001 at [redacted]w[redacted]d here for SOL.   Labor: Presented in SOL with change in MAU. When arrived to floor, contractions spaced out. AROM performed at 1100. Cervix checked 4 hours later and unchanged. Will start Pitocin 2x2.  Fetal Wellbeing:  Cat I  Pain Control:  Declines epidural.  Anticipated MOD:  NSVD   [name], D.O. OB Fellow  09/08/2018, 3:13 PM

## 2018-09-08 NOTE — Discharge Summary (Signed)
OB Discharge Summary     Patient Name: Phyllis Lawson DOB: [birth year] MRN: [id number]  Date of admission: 09/08/2018 Delivering MD: Patriciaann Clan   Date of discharge: 09/10/2018  Admitting diagnosis: 36WKS CTX Intrauterine pregnancy: [redacted]w[redacted]d     Secondary diagnosis:  Active Problems:   Supervision of other normal pregnancy, antepartum   Indication for care in labor or delivery   SVD (spontaneous vaginal delivery)  Additional problems: none     Discharge diagnosis: Term Pregnancy Delivered                                                                                                Post partum procedures:postpartum tubal ligation  Augmentation: AROM and Pitocin  Complications: None  Hospital course:  Onset of Labor With Vaginal Delivery     33 y.o. yo G2P1001 at [redacted]w[redacted]d was admitted in Active Labor on 09/08/2018. Patient had an uncomplicated labor course as follows:  Membrane Rupture Time/Date: 11:05 AM ,09/08/2018   Intrapartum Procedures: Episiotomy: None [1]                                         Lacerations:  None [1]  Patient had a delivery of a Viable infant. 09/08/2018  Information for the patient's newborn:  Dasie, Chancellor [[id number]]  Delivery Method: Vaginal, Spontaneous(Filed from Delivery Summary)     Pateint had an uncomplicated postpartum course.  She is ambulating, tolerating a regular diet, passing flatus, and urinating well. Patient is discharged home in stable condition on 09/10/18.   Physical exam  Vitals:   09/09/18 1745 09/09/18 2230 09/10/18 0250 09/10/18 0555  BP: 118/67 (!) 119/57 119/60 124/64  Pulse: 84 85 82 68  Resp: 18 16 16 16   Temp: 98 F (36.7 C) 98.4 F (36.9 C) 98 F (36.7 C) 98.4 F (36.9 C)  TempSrc: Oral Oral Oral Oral  SpO2: 95% 100% 97% 96%  Weight:      Height:       General: alert, cooperative and no distress Lochia: appropriate Uterine Fundus: firm Incision: Dressing is clean, dry, and intact DVT Evaluation:  No evidence of DVT seen on physical exam. Labs: Lab Results  Component Value Date   WBC 12.7 (H) 09/08/2018   HGB 11.3 (L) 09/08/2018   HCT 36.0 09/08/2018   MCV 84.1 09/08/2018   PLT 222 09/08/2018   CMP 08/08/2006  Glucose 113(H)  BUN 8  Creatinine 0.85  Sodium 136  Potassium 3.6  Chloride 105  CO2 25  Calcium 9.3  Total Protein 7.0  Total Bilirubin 0.8  Alkaline Phos 73  AST 15  ALT 10    Discharge instruction: per After Visit Summary and "Baby and Me Booklet".  After visit meds:  Allergies as of 09/10/2018   No Known Allergies     Medication List    TAKE these medications   ibuprofen 600 MG tablet Commonly known as: ADVIL Take 1 tablet (600 mg total) by mouth every 6 (six) hours.  oxyCODONE 5 MG immediate release tablet Commonly known as: Oxy IR/ROXICODONE Take 1 tablet (5 mg total) by mouth every 6 (six) hours as needed for moderate pain or breakthrough pain.   Prenatal Vitamin 27-0.8 MG Tabs Take 1 tablet by mouth daily.       Diet: routine diet  Activity: Advance as tolerated. Pelvic rest for 6 weeks.   Outpatient follow up:4 weeks Follow up Appt:No future appointments. Follow up Visit:No follow-ups on file.  Postpartum contraception: Tubal Ligation due PP   Please schedule this patient for Postpartum visit in: 4 weeks with the following provider: Any provider For C/S patients schedule nurse incision check in weeks 2 weeks: no Low risk pregnancy complicated by: none  Delivery mode:  SVD Anticipated Birth Control:  BTL done PP PP Procedures needed: None   Schedule Integrated BH visit: no  Newborn Data: Live born female  Birth Weight:   unknown  APGAR: 50, 9  Newborn Delivery   Birth date/time: 09/08/2018 20:04:00 Delivery type: Vaginal, Spontaneous      Baby Feeding: Breast Disposition:home with mother   09/10/2018 Hansel Feinstein, CNM

## 2018-09-09 ENCOUNTER — Inpatient Hospital Stay (HOSPITAL_COMMUNITY): Payer: 59 | Admitting: Anesthesiology

## 2018-09-09 ENCOUNTER — Encounter (HOSPITAL_COMMUNITY)
Admission: AD | Disposition: A | Discharge status: Home / Self Care | Payer: Self-pay | Source: Home / Self Care | Attending: Family Medicine

## 2018-09-09 ENCOUNTER — Encounter (HOSPITAL_COMMUNITY): Payer: Self-pay | Admitting: Anesthesiology

## 2018-09-09 HISTORY — PX: TUBAL LIGATION: SHX77

## 2018-09-09 LAB — RPR: RPR Ser Ql: NONREACTIVE

## 2018-09-09 SURGERY — LIGATION, FALLOPIAN TUBE, POSTPARTUM
Anesthesia: Spinal | Site: Abdomen | Wound class: Clean Contaminated

## 2018-09-09 MED ORDER — SODIUM CHLORIDE 0.9 % IR SOLN
Status: DC | PRN
  Administered 2018-09-09: 1000 mL

## 2018-09-09 MED ORDER — BUPIVACAINE HCL (PF) 0.25 % IJ SOLN
INTRAMUSCULAR | Status: AC
  Filled 2018-09-09: qty 30

## 2018-09-09 MED ORDER — FENTANYL CITRATE (PF) 100 MCG/2ML IJ SOLN
INTRAMUSCULAR | Status: DC | PRN
  Administered 2018-09-09: 100 ug via INTRAVENOUS

## 2018-09-09 MED ORDER — METOCLOPRAMIDE HCL 10 MG PO TABS
10.0000 mg | ORAL_TABLET | Freq: Once | ORAL | Status: AC
  Administered 2018-09-09: 10 mg via ORAL
  Filled 2018-09-09: qty 1

## 2018-09-09 MED ORDER — MEASLES, MUMPS & RUBELLA VAC IJ SOLR: 0.5000 mL | Freq: Once | INTRAMUSCULAR | Status: DC

## 2018-09-09 MED ORDER — MIDAZOLAM HCL 2 MG/2ML IJ SOLN
INTRAMUSCULAR | Status: AC
  Filled 2018-09-09: qty 2

## 2018-09-09 MED ORDER — LACTATED RINGERS IV SOLN
INTRAVENOUS | Status: DC
  Administered 2018-09-09: 20 mL/h via INTRAVENOUS

## 2018-09-09 MED ORDER — ONDANSETRON HCL 4 MG/2ML IJ SOLN
INTRAMUSCULAR | Status: AC
  Filled 2018-09-09: qty 2

## 2018-09-09 MED ORDER — BUPIVACAINE IN DEXTROSE 0.75-8.25 % IT SOLN
INTRATHECAL | Status: DC | PRN
  Administered 2018-09-09: 1.8 mL via INTRATHECAL

## 2018-09-09 MED ORDER — DEXAMETHASONE SODIUM PHOSPHATE 4 MG/ML IJ SOLN
INTRAMUSCULAR | Status: DC | PRN
  Administered 2018-09-09: 10 mg via INTRAVENOUS

## 2018-09-09 MED ORDER — FAMOTIDINE 20 MG PO TABS
40.0000 mg | ORAL_TABLET | Freq: Once | ORAL | Status: AC
  Administered 2018-09-09: 09:00:00 40 mg via ORAL
  Filled 2018-09-09: qty 2

## 2018-09-09 MED ORDER — OXYCODONE HCL 5 MG PO TABS
5.0000 mg | ORAL_TABLET | Freq: Four times a day (QID) | ORAL | Status: DC | PRN
  Administered 2018-09-10: 5 mg via ORAL
  Filled 2018-09-09: qty 1

## 2018-09-09 MED ORDER — ACETAMINOPHEN 10 MG/ML IV SOLN
INTRAVENOUS | Status: AC
  Filled 2018-09-09: qty 100

## 2018-09-09 MED ORDER — BUPIVACAINE HCL (PF) 0.25 % IJ SOLN
INTRAMUSCULAR | Status: DC | PRN
  Administered 2018-09-09: 30 mL

## 2018-09-09 MED ORDER — FENTANYL CITRATE (PF) 100 MCG/2ML IJ SOLN: INTRAMUSCULAR | Status: DC | PRN

## 2018-09-09 MED ORDER — ONDANSETRON HCL 4 MG/2ML IJ SOLN
INTRAMUSCULAR | Status: DC | PRN
  Administered 2018-09-09: 4 mg via INTRAVENOUS

## 2018-09-09 MED ORDER — FENTANYL CITRATE (PF) 100 MCG/2ML IJ SOLN: 25.0000 ug | INTRAMUSCULAR | Status: DC | PRN

## 2018-09-09 MED ORDER — ACETAMINOPHEN 10 MG/ML IV SOLN
1000.0000 mg | Freq: Once | INTRAVENOUS | Status: DC | PRN
  Administered 2018-09-09: 13:00:00 1000 mg via INTRAVENOUS

## 2018-09-09 MED ORDER — DEXAMETHASONE SODIUM PHOSPHATE 10 MG/ML IJ SOLN
INTRAMUSCULAR | Status: AC
  Filled 2018-09-09: qty 1

## 2018-09-09 MED ORDER — MIDAZOLAM HCL 2 MG/2ML IJ SOLN
INTRAMUSCULAR | Status: DC | PRN
  Administered 2018-09-09: 2 mg via INTRAVENOUS

## 2018-09-09 MED ORDER — FENTANYL CITRATE (PF) 100 MCG/2ML IJ SOLN
INTRAMUSCULAR | Status: AC
  Filled 2018-09-09: qty 2

## 2018-09-09 SURGICAL SUPPLY — 21 items
BLADE SURG 11 STRL SS (BLADE) ×3 IMPLANT
DRSG OPSITE POSTOP 3X4 (GAUZE/BANDAGES/DRESSINGS) ×3 IMPLANT
DURAPREP 26ML APPLICATOR (WOUND CARE) ×3 IMPLANT
GLOVE BIOGEL PI IND STRL 7.0 (GLOVE) ×1 IMPLANT
GLOVE BIOGEL PI IND STRL 7.5 (GLOVE) ×1 IMPLANT
GLOVE BIOGEL PI INDICATOR 7.0 (GLOVE) ×2
GLOVE BIOGEL PI INDICATOR 7.5 (GLOVE) ×2
GLOVE ECLIPSE 7.5 STRL STRAW (GLOVE) ×3 IMPLANT
GOWN STRL REUS W/TWL LRG LVL3 (GOWN DISPOSABLE) ×6 IMPLANT
NEEDLE HYPO 22GX1.5 SAFETY (NEEDLE) ×3 IMPLANT
NS IRRIG 1000ML POUR BTL (IV SOLUTION) ×3 IMPLANT
PACK ABDOMINAL MINOR (CUSTOM PROCEDURE TRAY) ×3 IMPLANT
PROTECTOR NERVE ULNAR (MISCELLANEOUS) ×3 IMPLANT
SPONGE LAP 18X18 RF (DISPOSABLE) ×3 IMPLANT
SPONGE LAP 4X18 RFD (DISPOSABLE) ×2 IMPLANT
SUT PLAIN 0 NONE (SUTURE) ×2 IMPLANT
SUT VICRYL 0 UR6 27IN ABS (SUTURE) ×3 IMPLANT
SUT VICRYL 4-0 PS2 18IN ABS (SUTURE) ×3 IMPLANT
SYR CONTROL 10ML LL (SYRINGE) ×3 IMPLANT
TOWEL OR 17X24 6PK STRL BLUE (TOWEL DISPOSABLE) ×6 IMPLANT
TRAY FOLEY W/BAG SLVR 14FR (SET/KITS/TRAYS/PACK) ×3 IMPLANT

## 2018-09-09 NOTE — Progress Notes (Signed)
POSTPARTUM PROGRESS NOTE  Post Partum Day 1  Subjective:  Phyllis Lawson is a 33 y.o. Q2M6381 s/p SVD at [redacted]w[redacted]d.  She reports she is doing well. No acute events overnight. She denies any problems with ambulating, voiding or po intake. Denies nausea or vomiting.  Pain is well controlled.  Lochia is mild.  Objective: Blood pressure 128/72, pulse 89, temperature 98 F (36.7 C), temperature source Oral, resp. rate 18, height 5\' 7"  (1.702 m), weight 95.3 kg, last menstrual period 12/29/2017, SpO2 100 %, unknown if currently breastfeeding.  Physical Exam:  General: alert, cooperative and no distress Chest: no respiratory distress Heart:regular rate, distal pulses intact Abdomen: soft, nontender,  Uterine Fundus: firm, appropriately tender DVT Evaluation: No calf swelling or tenderness Extremities: no edema Skin: warm, dry  Recent Labs    09/08/18 1000  HGB 11.3*  HCT 36.0    Assessment/Plan: Phyllis Lawson is a 33 y.o. R7N1657 s/p SVD at [redacted]w[redacted]d   PPD#1- Doing well  Routine postpartum care  Contraception: BTL this morning on 7/8 Feeding: Breastfeeding, going well   Dispo: Plan for discharge tomorrow.   LOS: 1 day   Darrelyn Hillock D.O. Family Medicine PGY-2  09/09/2018, 8:07 AM

## 2018-09-09 NOTE — Progress Notes (Signed)
Called anesthesia for consent to be signed, she stated she will see the patient in the preop area and consent then.

## 2018-09-09 NOTE — Anesthesia Preprocedure Evaluation (Signed)
Anesthesia Evaluation  Patient identified by MRN, date of birth, ID band Patient awake    Reviewed: Allergy & Precautions, NPO status , Patient's Chart, lab work & pertinent test results  Airway Mallampati: II  TM Distance: >3 FB Neck ROM: Full    Dental no notable dental hx.    Pulmonary neg pulmonary ROS,    Pulmonary exam normal breath sounds clear to auscultation       Cardiovascular negative cardio ROS Normal cardiovascular exam Rhythm:Regular Rate:Normal     Neuro/Psych negative neurological ROS  negative psych ROS   GI/Hepatic negative GI ROS, Neg liver ROS,   Endo/Other  negative endocrine ROS  Renal/GU negative Renal ROS  negative genitourinary   Musculoskeletal negative musculoskeletal ROS (+)   Abdominal   Peds  Hematology negative hematology ROS (+)   Anesthesia Other Findings PPT  Reproductive/Obstetrics                             Anesthesia Physical Anesthesia Plan  ASA: II  Anesthesia Plan: Epidural   Post-op Pain Management:    Induction:   PONV Risk Score and Plan: 2 and Treatment may vary due to age or medical condition  Airway Management Planned: Natural Airway  Additional Equipment:   Intra-op Plan:   Post-operative Plan:   Informed Consent: I have reviewed the patients History and Physical, chart, labs and discussed the procedure including the risks, benefits and alternatives for the proposed anesthesia with the patient or authorized representative who has indicated his/her understanding and acceptance.       Plan Discussed with: Anesthesiologist  Anesthesia Plan Comments:         Anesthesia Quick Evaluation

## 2018-09-09 NOTE — Op Note (Addendum)
Phyllis Lawson 09/09/2018  PREOPERATIVE DIAGNOSIS:  Undesired fertility POSTOPERATIVE DIAGNOSIS:  Undesired fertility  PROCEDURE:  Postpartum Bilateral Tubal Sterilization using Pomeroy method  ANESTHESIA:  Spinal  SURGEON: Dr. Laurey Arrow ASSISTANT: Dr. Gerri Lins  COMPLICATIONS:  None immediate. ESTIMATED BLOOD LOSS:  Less than 20cc. FLUIDS: 600 cc LR URINE OUTPUT: In/out catheter after procedure  INDICATIONS: 33 y.o. yo G2P2002  with undesired fertility,status post vaginal delivery, desires permanent sterilization. Risks and benefits of procedure discussed with patient including permanence of method, bleeding, infection, injury to surrounding organs and need for additional procedures. Risk failure of 0.5-1% with increased risk of ectopic gestation if pregnancy occurs was also discussed with patient. See Dr. Marcia Brash note for further consent conversation.    FINDINGS:  Normal uterus, tubes, and ovaries. Small subserosal fibroid noted.  TECHNIQUE: After informed consent was obtained, the patient was taken to the operating room where anesthesia was induced and found to be adequate. A small transverse, infraumbilical skin incision was made with the scalpel. This incision was carried down to the underlying layer of fascia. The fascia was grasped with Kocher clamps tented up and entered sharply with Mayo scissors. Underlying peritoneum was then identified tented up and entered sharply with Mayo scissors. [name] was then identified, brought to the incision, and grasped with a Babcock clamp. The tube was then followed out to the fimbria. The Babcock clamp was then used to grasp the tube approximately 4 cm from the cornual region. A 3 cm segment of the tube was then ligated with free tie of plain gut suture, transected and excised. Good hemostasis was noted and the tube was returned to the abdomen. The right fallopian tube was then identified to its fimbriated end, ligated,  and a 3 cm segment excised in a similar fashion. Excellent hemostasis was noted, and the tube returned to the abdomen. The fascia was re-approximated with 0 Vicryl. Small fascial defect noted on right side that was closed with 0 vicryl. The skin was closed in a subcuticular fashion with 3-0 Vicryl. Quarter percent Bupivacaine solution was then injected at the incision site. The patient tolerated the procedure well. Sponge, lap, and needle count were correct x2. The patient was taken to recovery room in stable condition.  Lambert Mody. Juleen China, DO OB/GYN Fellow, [facility] Faculty Practice    Attestation of Attending Supervision of Provider:  Evaluation and management procedures were performed by this provider under my supervision and collaboration. I have reviewed the provider's note and chart, and I agree with the management and plan.   Laurey Arrow, MD Faculty Practice, [facility]

## 2018-09-09 NOTE — Progress Notes (Signed)
Patient desires permanent sterilization.  Other reversible forms of contraception were discussed with patient; she declines all other modalities. Risks of procedure discussed with patient including but not limited to: risk of regret, permanence of method, bleeding, infection, injury to surrounding organs and need for additional procedures.  Failure risk of 1-2 % with increased risk of ectopic gestation if pregnancy occurs was also discussed with patient.  Patient verbalized understanding of these risks and wants to proceed with sterilization.  Written informed consent obtained.  To OR when ready.

## 2018-09-09 NOTE — Anesthesia Postprocedure Evaluation (Signed)
Anesthesia Post Note  Patient: Phyllis Lawson  Procedure(s) Performed: POST PARTUM TUBAL LIGATION (N/A Abdomen)     Patient location during evaluation: PACU Anesthesia Type: Spinal Level of consciousness: oriented and awake and alert Pain management: pain level controlled Vital Signs Assessment: post-procedure vital signs reviewed and stable Respiratory status: spontaneous breathing, respiratory function stable and patient connected to nasal cannula oxygen Cardiovascular status: blood pressure returned to baseline and stable Postop Assessment: no headache, no backache and no apparent nausea or vomiting Anesthetic complications: no    Last Vitals:  Vitals:   09/09/18 1315 09/09/18 1330  BP: 113/76   Pulse: 75 76  Resp: 15 16  Temp:    SpO2: 100% 100%    Last Pain:  Vitals:   09/09/18 1330  TempSrc:   PainSc: 0-No pain   Pain Goal:    LLE Motor Response: Purposeful movement (09/09/18 1330)   RLE Motor Response: Purposeful movement (09/09/18 1330)       Epidural/Spinal Function Cutaneous sensation: Tingles (09/09/18 1330), Patient able to flex knees: No (09/09/18 1330), Patient able to lift hips off bed: No (09/09/18 1330), Back pain beyond tenderness at insertion site: No (09/09/18 1330), Progressively worsening motor and/or sensory loss: No (09/09/18 1330), Bowel and/or bladder incontinence post epidural: No (09/09/18 1330)  [employer]

## 2018-09-09 NOTE — Progress Notes (Signed)
Patient transported to PACU for surgery.

## 2018-09-09 NOTE — Anesthesia Procedure Notes (Signed)
Spinal  Patient location during procedure: OR Start time: 09/09/2018 11:32 AM End time: 09/09/2018 11:42 AM Staffing Anesthesiologist: Freddrick March, MD Performed: anesthesiologist  Preanesthetic Checklist Completed: patient identified, surgical consent, pre-op evaluation, timeout performed, IV checked, risks and benefits discussed and monitors and equipment checked Spinal Block Patient position: sitting Prep: site prepped and draped and DuraPrep Patient monitoring: cardiac monitor, continuous pulse ox and blood pressure Approach: midline Location: L3-4 Injection technique: single-shot Needle Needle type: Pencan  Needle gauge: 24 G Needle length: 9 cm Assessment Sensory level: T6 Additional Notes Functioning IV was confirmed and monitors were applied. Sterile prep and drape, including hand hygiene and sterile gloves were used. The patient was positioned and the spine was prepped. The skin was anesthetized with lidocaine.  Free flow of clear CSF was obtained prior to injecting local anesthetic into the CSF.  The spinal needle aspirated freely following injection.  The needle was carefully withdrawn.  The patient tolerated the procedure well.

## 2018-09-09 NOTE — Transfer of Care (Signed)
Immediate Anesthesia Transfer of Care Note  Patient: Phyllis Lawson  Procedure(s) Performed: POST PARTUM TUBAL LIGATION (N/A Abdomen)  Patient Location: PACU  Anesthesia Type:Spinal  Level of Consciousness: awake, alert  and oriented  Airway & Oxygen Therapy: Patient Spontanous Breathing  Post-op Assessment: Report given to RN and Post -op Vital signs reviewed and stable  Post vital signs: Reviewed and stable  Last Vitals:  Vitals Value Taken Time  BP 107/60 09/09/18 1234  Temp    Pulse 75 09/09/18 1235  Resp 0 09/09/18 1235  SpO2 88 % 09/09/18 1235    Last Pain:  Vitals:   09/09/18 1234  TempSrc:   PainSc: 0-No pain         Complications: No apparent anesthesia complications

## 2018-09-10 LAB — BIRTH TISSUE RECOVERY COLLECTION (PLACENTA DONATION)

## 2018-09-10 MED ORDER — IBUPROFEN 600 MG PO TABS: 600.0000 mg | ORAL_TABLET | Freq: Four times a day (QID) | ORAL | 0 refills | Status: AC

## 2018-09-10 MED ORDER — OXYCODONE HCL 5 MG PO TABS: 5.0000 mg | ORAL_TABLET | Freq: Four times a day (QID) | ORAL | 0 refills | Status: DC | PRN

## 2018-09-10 NOTE — Discharge Instructions (Signed)

## 2018-09-10 NOTE — Lactation Note (Signed)
This note was copied from a baby's chart. Lactation Consultation Note Baby 66 hrs old. Mom's 2nd child, didn't BF her 1st child. Mom states BF going well, nipples are a little sore. Mom has been BF in cradle position. Discussed feeding positions, support, STS, I&O, breast massage, cluster feeding, supply and demand. Hand expression taught w/noted colostrum. Mom demonstrated and excited to see colostrum. Mom has short shaft everted nipples. Everts more w/stimulation. Shells given as well as hand pump for pre-pumping if needed to evert more to obtain deeper latch. Chin tug demonstrated. Encouraged mom to massage breast at intervals while feeding and assess for transfer. Mom acted excited. Breast filling and engorgement discussed. Encouraged to call for assistance if needed.  Lactation brochure given.  Patient Name: Phyllis Lawson Today's Date: 09/10/2018 Reason for consult: Initial assessment;1st time breastfeeding;Early term 37-38.6wks   Maternal Data Has patient been taught Hand Expression?: Yes Does the patient have breastfeeding experience prior to this delivery?: No  Feeding Feeding Type: Breast Fed  LATCH Score Latch: Grasps breast easily, tongue down, lips flanged, rhythmical sucking.  Audible Swallowing: A few with stimulation  Type of Nipple: Everted at rest and after stimulation  Comfort (Breast/Nipple): Filling, red/small blisters or bruises, mild/mod discomfort(sore/intact)  Hold (Positioning): Assistance needed to correctly position infant at breast and maintain latch.  LATCH Score: 7  Interventions Interventions: Breast feeding basics reviewed;Adjust position;Assisted with latch;Support pillows;Position options;Breast massage;Hand express;Pre-pump if needed;Shells;Breast compression;Hand pump  Lactation Tools Discussed/Used Tools: Shells;Pump Shell Type: Inverted Breast pump type: Manual WIC Program: No Pump Review: Setup, frequency, and cleaning;Milk  Storage Initiated by:: Allayne Stack RN IBCLC Date initiated:: 09/10/18   Consult Status Consult Status: Follow-up Date: 09/11/18 Follow-up type: In-patient    Shikha Bibb, [place]ta [place] 09/10/2018, 12:29 AM

## 2018-09-10 NOTE — Lactation Note (Signed)
This note was copied from a baby's chart. Lactation Consultation Note  Patient Name: Phyllis Lawson [id number] Date: 09/10/2018    Infant is 71 hrs old. Mom & baby are preparing to leave. Mom's breasts are filling. Her anatomy & vein distribution implies she will have an abundant supply. Nipples are atraumatic.   Mom says infant is "feeding good", but that it can be more difficult to get infant to latch to R side, but he is able to do so. Mom has a hand pump & a DEBP at home. I suggested she use the size 24 flanges when she is at home.   Hand expression was taught to Mom & colostrum was collected in a vial. I suggested that she dip a clean finger into vial and have infant suck on finger to entice infant to wake for feeding (s/p circ).   I wrote down our phone # so Mom can reach [place] after discharge, if needed.  Matthias Hughs [facility] 09/10/2018, 11:53 AM

## 2018-09-12 ENCOUNTER — Encounter (HOSPITAL_COMMUNITY): Payer: Self-pay | Admitting: Obstetrics and Gynecology

## 2018-09-15 ENCOUNTER — Telehealth: Payer: Self-pay | Admitting: *Deleted

## 2018-09-15 NOTE — Telephone Encounter (Signed)
-----   Message from Nicolette Bang, DO sent at 09/08/2018  8:26 PM EDT ----- Regarding: PP f/u Please schedule this patient for Postpartum visit in: 4 weeks with the following provider: Any provider For C/S patients schedule nurse incision check in weeks 2 weeks: no Low risk pregnancy complicated by: none  Delivery mode:  SVD Anticipated Birth Control:  BTL done PP PP Procedures needed: None   Schedule Integrated BH visit: no

## 2018-09-15 NOTE — Telephone Encounter (Signed)
Not able to leave patient a message to schedule 4 week Postpartum appointment, mailbox is full.

## 2018-10-05 NOTE — Progress Notes (Signed)
TELEHEALTH POSTPARTUM VIRTUAL VIDEO VISIT ENCOUNTER NOTE   Provider location: [facility] for [facility] at [place]   I connected with Phyllis Lawson on 10/06/18 at  1:15 PM EDT by MyChart Video Encounter at home and verified that I am speaking with the correct person using two identifiers.    I discussed the limitations, risks, security and privacy concerns of performing an evaluation and management service virtually and the availability of in person appointments. I also discussed with the patient that there may be a patient responsible charge related to this service. The patient expressed understanding and agreed to proceed.  Chief Complaint: Postpartum Visit  History of Present Illness: Phyllis Lawson is a 33 y.o. African-American G2P2002 being evaluated for postpartum followup.   She is 4 weeks postpartum following a spontaneous vaginal delivery. I have fully reviewed the prenatal and intrapartum course. The delivery was at 57 gestational weeks and 2 days.  Anesthesia: none. Postpartum course has been unremarkable. Baby's course has been unremarkable. Baby is feeding by bottle - Enfamil Neuro pro. Bleeding no bleeding. Bowel function is normal. Bladder function is normal. Patient is not sexually active. Contraception method is tubal ligation. Postpartum depression screening:neg  The following portions of the patient's history were reviewed and updated as appropriate: allergies, current medications, past family history, past medical history, past social history, past surgical history and problem list. Last pap smear done 03/16/18 and was Normal with positive HPV, negative HR strains of HPV   Review of Systems: Her 12 point review of systems is negative or as noted in the History of Present Illness.  Patient Active Problem List   Diagnosis Date Noted  . 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 Current Outpatient Medications  Medication Sig Dispense Refill  . ibuprofen (ADVIL) 600 MG tablet Take 1 tablet (600 mg total) by mouth every 6 (six) hours. 30 tablet 0  . Prenatal Vit-Fe Fumarate-FA (PRENATAL VITAMIN) 27-0.8 MG TABS Take 1 tablet by mouth daily. 30 tablet 2   No current facility-administered medications for this visit.     Allergies Patient has no known allergies.  Physical Exam:  LMP 12/29/2017 (LMP Unknown)   General:  Alert, oriented and cooperative. Patient is in no acute distress.  Mental Status: Normal mood and affect. Normal behavior. Normal judgment and thought content.   Respiratory: Normal respiratory effort noted, no problems with respiration noted  Rest of physical exam deferred due to type of encounter  PP Depression Screening:   Edinburgh Postnatal Depression Scale Screening Tool 10/06/2018 09/09/2018  I have been able to laugh and see the funny side of things. 0 0  I have looked forward with enjoyment to things. 0 0  I have blamed myself unnecessarily when things went wrong. 2 1  I have been anxious or worried for no good reason. 1 1  I have felt scared or panicky for no good reason. 0 1  Things have been getting on top of me. 0 0  I have been so unhappy that I have had difficulty sleeping. 0 0  I have felt sad or miserable. 0 0  I have been so unhappy that I have been crying. 0 0  The thought of harming myself has occurred to me. 0 0  Edinburgh Postnatal Depression Scale Total 3 3        Assessment/Plan:   1. 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  --Repeat pap with cotesting in 1 year per [facility]  2. Postpartum care following vaginal delivery Patient is a 33 y.o. [id number] who is 4 weeks postpartum from a normal spontaneous vaginal delivery.  She is doing well.  --Bonding well with infant, good support at home.  3. History of bilateral tubal ligation --BTL post  delivery     Social/emotional concerns:  none.  Contraception: tubal ligation  Follow up in: 1 year or as needed.   Fatima Blank, CNM 1:46 PM  [facility] for [place]

## 2018-10-06 ENCOUNTER — Telehealth (INDEPENDENT_AMBULATORY_CARE_PROVIDER_SITE_OTHER): Payer: 59 | Admitting: Advanced Practice Midwife

## 2018-10-06 ENCOUNTER — Other Ambulatory Visit: Payer: Self-pay

## 2018-10-06 ENCOUNTER — Encounter: Payer: Self-pay | Admitting: Advanced Practice Midwife

## 2018-10-06 DIAGNOSIS — Z1389 Encounter for screening for other disorder: Secondary | ICD-10-CM

## 2018-10-06 DIAGNOSIS — Z9851 Tubal ligation status: Secondary | ICD-10-CM

## 2018-10-06 DIAGNOSIS — R87821 Vaginal low risk human papillomavirus (HPV) DNA test positive: Secondary | ICD-10-CM | POA: Insufficient documentation
